# Patient Record
Sex: Male | Born: 1950 | Race: White | Hispanic: No | Marital: Married | Smoking: Former smoker
Health system: Southern US, Community
[De-identification: ages and names within clinical notes are randomized; demographics above are authoritative.]

## PROBLEM LIST (undated history)

## (undated) DIAGNOSIS — J449 Chronic obstructive pulmonary disease, unspecified: Secondary | ICD-10-CM

## (undated) DIAGNOSIS — E119 Type 2 diabetes mellitus without complications: Secondary | ICD-10-CM

## (undated) DIAGNOSIS — J45909 Unspecified asthma, uncomplicated: Secondary | ICD-10-CM

## (undated) DIAGNOSIS — H409 Unspecified glaucoma: Secondary | ICD-10-CM

## (undated) DIAGNOSIS — Z9889 Other specified postprocedural states: Secondary | ICD-10-CM

## (undated) DIAGNOSIS — C801 Malignant (primary) neoplasm, unspecified: Secondary | ICD-10-CM

## (undated) DIAGNOSIS — I10 Essential (primary) hypertension: Secondary | ICD-10-CM

## (undated) DIAGNOSIS — M1990 Unspecified osteoarthritis, unspecified site: Secondary | ICD-10-CM

## (undated) DIAGNOSIS — E785 Hyperlipidemia, unspecified: Secondary | ICD-10-CM

## (undated) DIAGNOSIS — G4730 Sleep apnea, unspecified: Secondary | ICD-10-CM

## (undated) DIAGNOSIS — K219 Gastro-esophageal reflux disease without esophagitis: Secondary | ICD-10-CM

## (undated) DIAGNOSIS — R112 Nausea with vomiting, unspecified: Secondary | ICD-10-CM

## (undated) HISTORY — DX: Unspecified osteoarthritis, unspecified site: M19.90

## (undated) HISTORY — DX: Hyperlipidemia, unspecified: E78.5

## (undated) HISTORY — PX: NECK SURGERY: SHX720

## (undated) HISTORY — DX: Unspecified glaucoma: H40.9

## (undated) HISTORY — PX: BACK SURGERY: SHX140

## (undated) HISTORY — PX: DUPUYTREN CONTRACTURE RELEASE: SHX1478

## (undated) HISTORY — DX: Gastro-esophageal reflux disease without esophagitis: K21.9

## (undated) HISTORY — DX: Unspecified asthma, uncomplicated: J45.909

## (undated) HISTORY — DX: Sleep apnea, unspecified: G47.30

---
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
## 1996-08-24 HISTORY — PX: KNEE ARTHROSCOPY: SUR90

## 2005-04-30 ENCOUNTER — Ambulatory Visit: Payer: Self-pay | Admitting: Gastroenterology

## 2005-05-08 ENCOUNTER — Ambulatory Visit: Payer: Self-pay | Admitting: Gastroenterology

## 2005-06-18 ENCOUNTER — Ambulatory Visit: Payer: Self-pay | Admitting: Gastroenterology

## 2006-04-19 ENCOUNTER — Other Ambulatory Visit: Payer: Self-pay

## 2006-04-21 ENCOUNTER — Ambulatory Visit: Payer: Self-pay | Admitting: Podiatry

## 2006-08-24 HISTORY — PX: EXCISION MORTON'S NEUROMA: SHX5013

## 2006-12-23 ENCOUNTER — Encounter: Payer: Self-pay | Admitting: Neurosurgery

## 2008-04-19 ENCOUNTER — Ambulatory Visit: Payer: Self-pay | Admitting: Family Medicine

## 2009-01-31 ENCOUNTER — Ambulatory Visit: Payer: Self-pay | Admitting: Family Medicine

## 2009-02-19 ENCOUNTER — Ambulatory Visit: Payer: Self-pay | Admitting: Nurse Practitioner

## 2009-04-04 ENCOUNTER — Ambulatory Visit: Payer: Self-pay | Admitting: Family Medicine

## 2010-10-26 ENCOUNTER — Ambulatory Visit: Payer: Self-pay | Admitting: Internal Medicine

## 2010-11-08 ENCOUNTER — Ambulatory Visit: Payer: Self-pay | Admitting: Internal Medicine

## 2010-11-15 ENCOUNTER — Ambulatory Visit: Payer: Self-pay | Admitting: Internal Medicine

## 2013-02-10 ENCOUNTER — Ambulatory Visit: Payer: Self-pay | Admitting: Unknown Physician Specialty

## 2015-08-20 ENCOUNTER — Encounter: Payer: Self-pay | Admitting: Respiratory Therapy

## 2015-08-20 ENCOUNTER — Encounter: Payer: BC Managed Care – PPO | Attending: Specialist | Admitting: Respiratory Therapy

## 2015-08-20 VITALS — Ht 73.0 in | Wt 264.0 lb

## 2015-08-20 DIAGNOSIS — M5030 Other cervical disc degeneration, unspecified cervical region: Secondary | ICD-10-CM | POA: Insufficient documentation

## 2015-08-20 DIAGNOSIS — E119 Type 2 diabetes mellitus without complications: Secondary | ICD-10-CM | POA: Insufficient documentation

## 2015-08-20 DIAGNOSIS — C439 Malignant melanoma of skin, unspecified: Secondary | ICD-10-CM | POA: Insufficient documentation

## 2015-08-20 DIAGNOSIS — G4733 Obstructive sleep apnea (adult) (pediatric): Secondary | ICD-10-CM

## 2015-08-20 DIAGNOSIS — I10 Essential (primary) hypertension: Secondary | ICD-10-CM | POA: Insufficient documentation

## 2015-08-20 DIAGNOSIS — J45909 Unspecified asthma, uncomplicated: Secondary | ICD-10-CM | POA: Insufficient documentation

## 2015-08-20 DIAGNOSIS — H409 Unspecified glaucoma: Secondary | ICD-10-CM | POA: Insufficient documentation

## 2015-08-20 DIAGNOSIS — E785 Hyperlipidemia, unspecified: Secondary | ICD-10-CM | POA: Insufficient documentation

## 2015-08-20 DIAGNOSIS — K219 Gastro-esophageal reflux disease without esophagitis: Secondary | ICD-10-CM | POA: Insufficient documentation

## 2015-08-20 DIAGNOSIS — J449 Chronic obstructive pulmonary disease, unspecified: Secondary | ICD-10-CM | POA: Diagnosis present

## 2015-08-20 DIAGNOSIS — K760 Fatty (change of) liver, not elsewhere classified: Secondary | ICD-10-CM | POA: Insufficient documentation

## 2015-08-20 DIAGNOSIS — K589 Irritable bowel syndrome without diarrhea: Secondary | ICD-10-CM | POA: Insufficient documentation

## 2015-08-20 DIAGNOSIS — J309 Allergic rhinitis, unspecified: Secondary | ICD-10-CM | POA: Insufficient documentation

## 2015-08-20 NOTE — Progress Notes (Signed)
Pulmonary Individual Treatment Plan  Patient Details  Name: Adrian Harris. MRN: [id number] Date of Birth: 10-06-1950 Referring Provider:  Erby Pian, MD  Initial Encounter Date: Date: 08/20/15  Visit Diagnosis: COPD, mild ([place])  Obstructive sleep apnea  Asthma, unspecified asthma severity, uncomplicated  Patient's Home Medications on Admission: No current outpatient prescriptions on file.  Past Medical History: No past medical history on file.  Tobacco Use: History  Smoking status  . Former Smoker -- 2.00 packs/day for 9 years  . Types: Cigarettes  Smokeless tobacco  . Former Systems developer  . Quit date: 11/11/1974    Labs: Recent Review Flowsheet Data    There is no flowsheet data to display.       ADL UCSD:     ADL UCSD      08/20/15 0830       ADL UCSD   ADL Phase Entry     SOB Score total 34     Rest 0     Walk 1     Stairs 2     Bath 0     Dress 0     Shop 2         Pulmonary Function Assessment:     Pulmonary Function Assessment - 08/20/15 0830    Pulmonary Function Tests   RV% 74 %   DLCO% 94 %   Initial Spirometry Results   FVC% 94 %   FEV1% 81 %   FEV1/FVC Ratio 68   Breath   Bilateral Breath Sounds Clear   Shortness of Breath Yes;Limiting activity      Exercise Target Goals: Date: 08/20/15  Exercise Program Goal: Individual exercise prescription set with THRR, safety & activity barriers. Participant demonstrates ability to understand and report RPE using BORG scale, to self-measure pulse accurately, and to acknowledge the importance of the exercise prescription.  Exercise Prescription Goal: Starting with aerobic activity 30 plus minutes a day, 3 days per week for initial exercise prescription. Provide home exercise prescription and guidelines that participant acknowledges understanding prior to discharge.  Activity Barriers & Risk Stratification:     Activity Barriers & Risk Stratification - 08/20/15 0830    Activity  Barriers & Risk Stratification   Activity Barriers Shortness of Breath;Arthritis;Deconditioning;Muscular Weakness   Risk Stratification Moderate      6 Minute Walk:     6 Minute Walk      08/20/15 1004       6 Minute Walk   Phase Initial     Distance 1700 feet     Walk Time 6 minutes     Resting HR 74 bpm     Resting BP 118/74 mmHg     Max Ex. HR 117 bpm     Max Ex. BP 144/76 mmHg     RPE 12     Perceived Dyspnea  3     Symptoms No        Initial Exercise Prescription:     Initial Exercise Prescription - 08/20/15 1000    Date of Initial Exercise Prescription   Date 08/20/15   Treadmill   MPH 2.5   Grade 0   Minutes 10   Recumbant Bike   Level 2   RPM 40   Watts 20   Minutes 10   NuStep   Level 2   Watts 50   Minutes 10   Arm Ergometer   Level 1   Watts 10   Minutes 10   Recumbant Elliptical  Level 2   RPM 40   Watts 20   Minutes 10   Elliptical   Level 1   Speed 3   Minutes 1   REL-XR   Level 3   Watts 50   Minutes 10   T5 Nustep   Level 1   Watts 20   Minutes 10   Biostep-RELP   Level 3   Watts 50   Minutes 10   Prescription Details   Duration Progress to 30 minutes of continuous aerobic without signs/symptoms of physical distress   Intensity   THRR REST +  30   Ratings of Perceived Exertion 11-15   Perceived Dyspnea 2-4   Progression Continue progressive overload as per policy without signs/symptoms or physical distress.   Resistance Training   Training Prescription Yes   Weight 2   Reps 10-15      Exercise Prescription Changes:   Discharge Exercise Prescription (Final Exercise Prescription Changes):    Nutrition:  Target Goals: Understanding of nutrition guidelines, daily intake of sodium 1500mg , cholesterol 200mg , calories 30% from fat and 7% or less from saturated fats, daily to have 5 or more servings of fruits and vegetables.  Biometrics:      Post Biometrics - 08/20/15 1006     Post  Biometrics   Height 6'  1" (1.854 m)   Weight 264 lb (119.75 kg)   Waist Circumference 46 inches   Hip Circumference 48 inches   Waist to Hip Ratio 0.96 %   BMI (Calculated) 34.9      Nutrition Therapy Plan and Nutrition Goals:     Nutrition Therapy & Goals - 08/20/15 0830    Nutrition Therapy   Diet Adrian Harris would like to loss weight, but he is not sure he wants to meet with the dietitian. He has cut down on his portion sizes and has recently lost 8lbs.      Nutrition Discharge: Rate Your Plate Scores:   Psychosocial: Target Goals: Acknowledge presence or absence of depression, maximize coping skills, provide positive support system. Participant is able to verbalize types and ability to use techniques and skills needed for reducing stress and depression.  Initial Review & Psychosocial Screening:     Initial Psych Review & Screening - 08/20/15 0830    Family Dynamics   Good Support System? Yes   Comments Adrian Harris has good support from his wife and children.   Barriers   Psychosocial barriers to participate in program There are no identifiable barriers or psychosocial needs.;The patient should benefit from training in stress management and relaxation.   Screening Interventions   Interventions Encouraged to exercise      Quality of Life Scores:     Quality of Life - 08/20/15 0830    Quality of Life Scores   Health/Function Pre 16.91 %   Socioeconomic Pre 28.17 %   Psych/Spiritual Pre 26.79 %   Family Pre 27.9 %   GLOBAL Pre 22.54 %      PHQ-9:     Recent Review Flowsheet Data    Depression screen [facility] 2/9 08/20/2015   Decreased Interest 0   Down, Depressed, Hopeless 0   PHQ - 2 Score 0      Psychosocial Evaluation and Intervention:   Psychosocial Re-Evaluation:  Education: Education Goals: Education classes will be provided on a weekly basis, covering required topics. Participant will state understanding/return demonstration of topics presented.  Learning  Barriers/Preferences:     Learning Barriers/Preferences - 08/20/15 0830  Learning Barriers/Preferences   Learning Barriers None   Learning Preferences Group Instruction;Individual Instruction;Pictoral;Skilled Demonstration;Verbal Instruction;Video;Written Material      Education Topics: Initial Evaluation Education: - Verbal, written and demonstration of respiratory meds, RPE/PD scales, oximetry and breathing techniques. Instruction on use of nebulizers and MDIs: cleaning and proper use, rinsing mouth with steroid doses and importance of monitoring MDI activations.          Pulmonary Rehab from 08/20/2015 in [place]   Date  08/20/15   Educator  LB   Instruction Review Code  2- meets goals/outcomes      General Nutrition Guidelines/Fats and Fiber: -Group instruction provided by verbal, written material, models and posters to present the general guidelines for heart healthy nutrition. Gives an explanation and review of dietary fats and fiber.   Controlling Sodium/Reading Food Labels: -Group verbal and written material supporting the discussion of sodium use in heart healthy nutrition. Review and explanation with models, verbal and written materials for utilization of the food label.   Exercise Physiology & Risk Factors: - Group verbal and written instruction with models to review the exercise physiology of the cardiovascular system and associated critical values. Details cardiovascular disease risk factors and the goals associated with each risk factor.   Aerobic Exercise & Resistance Training: - Gives group verbal and written discussion on the health impact of inactivity. On the components of aerobic and resistive training programs and the benefits of this training and how to safely progress through these programs.   Flexibility, Balance, General Exercise Guidelines: - Provides group verbal and written instruction on the benefits of  flexibility and balance training programs. Provides general exercise guidelines with specific guidelines to those with heart or lung disease. Demonstration and skill practice provided.   Stress Management: - Provides group verbal and written instruction about the health risks of elevated stress, cause of high stress, and healthy ways to reduce stress.   Depression: - Provides group verbal and written instruction on the correlation between heart/lung disease and depressed mood, treatment options, and the stigmas associated with seeking treatment.   Exercise & Equipment Safety: - Individual verbal instruction and demonstration of equipment use and safety with use of the equipment.   Infection Prevention: - Provides verbal and written material to individual with discussion of infection control including proper hand washing and proper equipment cleaning during exercise session.      Pulmonary Rehab from 08/20/2015 in [place]   Date  08/20/15   Educator  LB   Instruction Review Code  2- meets goals/outcomes      Falls Prevention: - Provides verbal and written material to individual with discussion of falls prevention and safety.      Pulmonary Rehab from 08/20/2015 in [place]   Date  08/20/15   Educator  LB   Instruction Review Code  2- meets goals/outcomes      Diabetes: - Individual verbal and written instruction to review signs/symptoms of diabetes, desired ranges of glucose level fasting, after meals and with exercise. Advice that pre and post exercise glucose checks will be done for 3 sessions at entry of program.   Chronic Lung Diseases: - Group verbal and written instruction to review new updates, new respiratory medications, new advancements in procedures and treatments. Provide informative websites and "800" numbers of self-education.   Lung Procedures: - Group verbal and written  instruction to describe testing methods done to diagnose lung disease.  Review the outcome of test results. Describe the treatment choices: Pulmonary Function Tests, ABGs and oximetry.   Energy Conservation: - Provide group verbal and written instruction for methods to conserve energy, plan and organize activities. Instruct on pacing techniques, use of adaptive equipment and posture/positioning to relieve shortness of breath.   Triggers: - Group verbal and written instruction to review types of environmental controls: home humidity, furnaces, filters, dust mite/pet prevention, HEPA vacuums. To discuss weather changes, air quality and the benefits of nasal washing.   Exacerbations: - Group verbal and written instruction to provide: warning signs, infection symptoms, calling MD promptly, preventive modes, and value of vaccinations. Review: effective airway clearance, coughing and/or vibration techniques. Create an Sports administrator.   Oxygen: - Individual and group verbal and written instruction on oxygen therapy. Includes supplement oxygen, available portable oxygen systems, continuous and intermittent flow rates, oxygen safety, concentrators, and Medicare reimbursement for oxygen.   Respiratory Medications: - Group verbal and written instruction to review medications for lung disease. Drug class, frequency, complications, importance of spacers, rinsing mouth after steroid MDI's, and proper cleaning methods for nebulizers.      Pulmonary Rehab from 08/20/2015 in [place]   Date  08/20/15   Educator  LB   Instruction Review Code  2- meets goals/outcomes      AED/CPR: - Group verbal and written instruction with the use of models to demonstrate the basic use of the AED with the basic ABC's of resuscitation.   Breathing Retraining: - Provides individuals verbal and written instruction on purpose, frequency, and proper technique of diaphragmatic breathing  and pursed-lipped breathing. Applies individual practice skills.      Pulmonary Rehab from 08/20/2015 in [place]   Date  08/20/15   Educator  LB   Instruction Review Code  2- meets goals/outcomes      Anatomy and Physiology of the Lungs: - Group verbal and written instruction with the use of models to provide basic lung anatomy and physiology related to function, structure and complications of lung disease.   Heart Failure: - Group verbal and written instruction on the basics of heart failure: signs/symptoms, treatments, explanation of ejection fraction, enlarged heart and cardiomyopathy.   Sleep Apnea: - Individual verbal and written instruction to review Obstructive Sleep Apnea. Review of risk factors, methods for diagnosing and types of masks and machines for OSA.   Anxiety: - Provides group, verbal and written instruction on the correlation between heart/lung disease and anxiety, treatment options, and management of anxiety.   Relaxation: - Provides group, verbal and written instruction about the benefits of relaxation for patients with heart/lung disease. Also provides patients with examples of relaxation techniques.   Knowledge Questionnaire Score:     Knowledge Questionnaire Score - 08/20/15 0830    Knowledge Questionnaire Score   Pre Score -3      Personal Goals and Risk Factors at Admission:     Personal Goals and Risk Factors at Admission - 08/20/15 0830    Personal Goals and Risk Factors on Admission    Weight Management Yes   Intervention Learn and follow the exercise and diet guidelines while in the program. Utilize the nutrition and education classes to help gain knowledge of the diet and exercise expectations in the program  Adrian Harris would like to loss weight, but he is not sure he wants to meet with the dietitian. He has cut down on his portion sizes and has recently lost  8lbs.   Admit Weight 264 lb (119.75 kg)    Goal Weight 200 lb (90.719 kg)   Increase Aerobic Exercise and Physical Activity Yes   Intervention While in program, learn and follow the exercise prescription taught. Start at a low level workload and increase workload after able to maintain previous level for 30 minutes. Increase time before increasing intensity.  Adrian Harris would like to increase his exercise capacity. He has a home treadmill, stationary bike, and weights.   Understand more about Heart/Pulmonary Disease. Yes   Intervention While in program utilize professionals for any questions, and attend the education sessions. Great websites to use are www.americanheart.org or www.lung.org for reliable information.  Adrian Harris has Mild COPD, Asthma, and OSA and is very interested in learning new knowledge of these diseases and how to manage them.   Improve shortness of breath with ADL's Yes   Intervention While in program, learn and follow the exercise prescription taught. Start at a low level workload and increase workload ad advised by the exercise physiologist. Increase time before increasing intensity.  Adrian Harris goes at thing "all out" and does experience shortness of breath. He will benefit from supervised exercise, pacing , and PLB.   Develop more efficient breathing techniques such as purse lipped breathing and diaphragmatic breathing; and practicing self-pacing with activity Yes   Intervention While in program, learn and utilize the specific breathing techniques taught to you. Continue to practice and use the techniques as needed.  Demonstrated good technique and understanding of PLBing   Increase knowledge of respiratory medications and ability to use respiratory devices properly.  Yes   Intervention While in program, learn to administer MDI, nebulizer, and spacer properly.;Learn to take respiratory medicine as ordered.;While in program, learn to Clean MDI, nebulizers, and spacers properly.  Adrian Harris takes Spiriva, Symbicort, and  Proventil MDI. Educated on these inhalers and gave Adrian Harris a spacer. Adrian Harris is also on CPAP, full mask, provided by the [place].   Diabetes Yes   Goal Blood glucose control identified by blood glucose values, HgbA1C. Participant verbalizes understanding of the signs/symptoms of hyper/hypo glycemia, proper foot care and importance of medication and nutrition plan for blood glucose control.   Intervention Provide nutrition & aerobic exercise along with prescribed medications to achieve blood glucose in normal ranges: Fasting 65-99 mg/dL   Hypertension Yes   Goal Participant will see blood pressure controlled within the values of 140/11mm/Hg or within value directed by their physician.   Intervention Provide nutrition & aerobic exercise along with prescribed medications to achieve BP 140/90 or less.      Personal Goals and Risk Factors Review:    Personal Goals Discharge (Final Personal Goals and Risk Factors Review):    ITP Comments:   Comments:

## 2015-08-20 NOTE — Patient Instructions (Signed)
Patient Instructions  Patient Details  Name: Avyn Hennesy. MRN: [id number] Date of Birth: July 30, 1951 Referring Provider:  Erby Pian, MD  Below are the personal goals you chose as well as exercise and nutrition goals. Our goal is to help you keep on track towards obtaining and maintaining your goals. We will be discussing your progress on these goals with you throughout the program.  Initial Exercise Prescription:     Initial Exercise Prescription - 08/20/15 1000    Date of Initial Exercise Prescription   Date 08/20/15   Treadmill   MPH 2.5   Grade 0   Minutes 10   Recumbant Bike   Level 2   RPM 40   Watts 20   Minutes 10   NuStep   Level 2   Watts 50   Minutes 10   Arm Ergometer   Level 1   Watts 10   Minutes 10   Recumbant Elliptical   Level 2   RPM 40   Watts 20   Minutes 10   Elliptical   Level 1   Speed 3   Minutes 1   REL-XR   Level 3   Watts 50   Minutes 10   T5 Nustep   Level 1   Watts 20   Minutes 10   Biostep-RELP   Level 3   Watts 50   Minutes 10   Prescription Details   Duration Progress to 30 minutes of continuous aerobic without signs/symptoms of physical distress   Intensity   THRR REST +  30   Ratings of Perceived Exertion 11-15   Perceived Dyspnea 2-4   Progression Continue progressive overload as per policy without signs/symptoms or physical distress.   Resistance Training   Training Prescription Yes   Weight 2   Reps 10-15      Exercise Goals: Frequency: Be able to perform aerobic exercise three times per week working toward 3-5 days per week.  Intensity: Work with a perceived exertion of 11 (fairly light) - 15 (hard) as tolerated. Follow your new exercise prescription and watch for changes in prescription as you progress with the program. Changes will be reviewed with you when they are made.  Duration: You should be able to do 30 minutes of continuous aerobic exercise in addition to a 5 minute warm-up and a 5  minute cool-down routine.  Nutrition Goals: Your personal nutrition goals will be established when you do your nutrition analysis with the dietician.  The following are nutrition guidelines to follow: Cholesterol < 200mg /day Sodium < 1500mg /day Fiber: Men over 50 yrs - 30 grams per day  Personal Goals:     Personal Goals and Risk Factors at Admission - 08/20/15 0830    Personal Goals and Risk Factors on Admission    Weight Management Yes   Intervention Learn and follow the exercise and diet guidelines while in the program. Utilize the nutrition and education classes to help gain knowledge of the diet and exercise expectations in the program  Mr Fawver would like to loss weight, but he is not sure he wants to meet with the dietitian. He has cut down on his portion sizes and has recently lost 8lbs.   Admit Weight 264 lb (119.75 kg)   Goal Weight 200 lb (90.719 kg)   Increase Aerobic Exercise and Physical Activity Yes   Intervention While in program, learn and follow the exercise prescription taught. Start at a low level workload and increase workload after able  to maintain previous level for 30 minutes. Increase time before increasing intensity.  Mr Sollenberger would like to increase his exercise capacity. He has a home treadmill, stationary bike, and weights.   Understand more about Heart/Pulmonary Disease. Yes   Intervention While in program utilize professionals for any questions, and attend the education sessions. Great websites to use are www.americanheart.org or www.lung.org for reliable information.  Ms Colvin has Mild COPD, Asthma, and OSA and is very interested in learning new knowledge of these diseases and how to manage them.   Improve shortness of breath with ADL's Yes   Intervention While in program, learn and follow the exercise prescription taught. Start at a low level workload and increase workload ad advised by the exercise physiologist. Increase time before increasing intensity.   Mr Morera goes at thing "all out" and does experience shortness of breath. He will benefit from supervised exercise, pacing , and PLB.   Develop more efficient breathing techniques such as purse lipped breathing and diaphragmatic breathing; and practicing self-pacing with activity Yes   Intervention While in program, learn and utilize the specific breathing techniques taught to you. Continue to practice and use the techniques as needed.  Demonstrated good technique and understanding of PLBing   Increase knowledge of respiratory medications and ability to use respiratory devices properly.  Yes   Intervention While in program, learn to administer MDI, nebulizer, and spacer properly.;Learn to take respiratory medicine as ordered.;While in program, learn to Clean MDI, nebulizers, and spacers properly.  Mr Saric takes Spiriva, Symbicort, and Proventil MDI. Educated on these inhalers and gave Mr Dollard a spacer. Mr Ehle is also on CPAP, full mask, provided by the [place].   Diabetes Yes   Goal Blood glucose control identified by blood glucose values, HgbA1C. Participant verbalizes understanding of the signs/symptoms of hyper/hypo glycemia, proper foot care and importance of medication and nutrition plan for blood glucose control.   Intervention Provide nutrition & aerobic exercise along with prescribed medications to achieve blood glucose in normal ranges: Fasting 65-99 mg/dL   Hypertension Yes   Goal Participant will see blood pressure controlled within the values of 140/69mm/Hg or within value directed by their physician.   Intervention Provide nutrition & aerobic exercise along with prescribed medications to achieve BP 140/90 or less.      Tobacco Use Initial Evaluation: History  Smoking status  . Former Smoker -- 2.00 packs/day for 9 years  . Types: Cigarettes  Smokeless tobacco  . Former Systems developer  . Quit date: 11/11/1974    Copy of goals given to participant.

## 2015-08-21 NOTE — Progress Notes (Signed)
Pulmonary Individual Treatment Plan  Patient Details  Name: Adrian Harris. MRN: [id number] Date of Birth: 24-Sep-1950 Referring Provider:  Erby Pian, MD  Initial Encounter Date: Date: 08/20/15  Visit Diagnosis: COPD, mild ([place]) - Plan: PULMONARY REHAB 30 DAY REVIEW  Obstructive sleep apnea - Plan: PULMONARY REHAB 30 DAY REVIEW  Asthma, unspecified asthma severity, uncomplicated - Plan: PULMONARY REHAB 30 DAY REVIEW  Patient's Home Medications on Admission:  Current outpatient prescriptions:    albuterol (PROAIR HFA) 108 (90 BASE) MCG/ACT inhaler, Inhale into the lungs., Disp: , Rfl:    glucose blood (ACCU-CHEK COMFORT CURVE) test strip, , Disp: , Rfl:    lisinopril (PRINIVIL,ZESTRIL) 20 MG tablet, Take by mouth., Disp: , Rfl:    metFORMIN (GLUCOPHAGE-XR) 500 MG 24 hr tablet, Take by mouth., Disp: , Rfl:    mometasone (NASONEX) 50 MCG/ACT nasal spray, , Disp: , Rfl:    budesonide-formoterol (SYMBICORT) 160-4.5 MCG/ACT inhaler, Inhale into the lungs., Disp: , Rfl:    Cholecalciferol (VITAMIN D-1000 MAX ST) 1000 UNITS tablet, Take by mouth., Disp: , Rfl:    tiotropium (SPIRIVA) 18 MCG inhalation capsule, Place into inhaler and inhale., Disp: , Rfl:    vitamin B-12 (CYANOCOBALAMIN) 1000 MCG tablet, Take by mouth., Disp: , Rfl:   Past Medical History: No past medical history on file.  Tobacco Use: History  Smoking status   Former Smoker -- 2.00 packs/day for 9 years   Types: Cigarettes  Smokeless tobacco   Former Systems developer   Quit date: 11/11/1974    Labs: Recent Review Flowsheet Data    There is no flowsheet data to display.       ADL UCSD:     ADL UCSD      08/20/15 0830       ADL UCSD   ADL Phase Entry     SOB Score total 34     Rest 0     Walk 1     Stairs 2     Bath 0     Dress 0     Shop 2         Pulmonary Function Assessment:     Pulmonary Function Assessment - 08/20/15 0830    Pulmonary Function Tests   RV% 74 %   DLCO% 94  %   Initial Spirometry Results   FVC% 94 %   FEV1% 81 %   FEV1/FVC Ratio 68   Breath   Bilateral Breath Sounds Clear   Shortness of Breath Yes;Limiting activity      Exercise Target Goals: Date: 08/20/15  Exercise Program Goal: Individual exercise prescription set with THRR, safety & activity barriers. Participant demonstrates ability to understand and report RPE using BORG scale, to self-measure pulse accurately, and to acknowledge the importance of the exercise prescription.  Exercise Prescription Goal: Starting with aerobic activity 30 plus minutes a day, 3 days per week for initial exercise prescription. Provide home exercise prescription and guidelines that participant acknowledges understanding prior to discharge.  Activity Barriers & Risk Stratification:     Activity Barriers & Risk Stratification - 08/20/15 0830    Activity Barriers & Risk Stratification   Activity Barriers Shortness of Breath;Arthritis;Deconditioning;Muscular Weakness   Risk Stratification Moderate      6 Minute Walk:     6 Minute Walk      08/20/15 1004       6 Minute Walk   Phase Initial     Distance 1700 feet     Walk  Time 6 minutes     Resting HR 74 bpm     Resting BP 118/74 mmHg     Max Ex. HR 117 bpm     Max Ex. BP 144/76 mmHg     RPE 12     Perceived Dyspnea  3     Symptoms No        Initial Exercise Prescription:     Initial Exercise Prescription - 08/20/15 1000    Date of Initial Exercise Prescription   Date 08/20/15   Treadmill   MPH 2.5   Grade 0   Minutes 10   Recumbant Bike   Level 2   RPM 40   Watts 20   Minutes 10   NuStep   Level 2   Watts 50   Minutes 10   Arm Ergometer   Level 1   Watts 10   Minutes 10   Recumbant Elliptical   Level 2   RPM 40   Watts 20   Minutes 10   Elliptical   Level 1   Speed 3   Minutes 1   REL-XR   Level 3   Watts 50   Minutes 10   T5 Nustep   Level 1   Watts 20   Minutes 10   Biostep-RELP   Level 3   Watts  50   Minutes 10   Prescription Details   Duration Progress to 30 minutes of continuous aerobic without signs/symptoms of physical distress   Intensity   THRR REST +  30   Ratings of Perceived Exertion 11-15   Perceived Dyspnea 2-4   Progression Continue progressive overload as per policy without signs/symptoms or physical distress.   Resistance Training   Training Prescription Yes   Weight 2   Reps 10-15      Exercise Prescription Changes:   Discharge Exercise Prescription (Final Exercise Prescription Changes):    Nutrition:  Target Goals: Understanding of nutrition guidelines, daily intake of sodium 1500mg , cholesterol 200mg , calories 30% from fat and 7% or less from saturated fats, daily to have 5 or more servings of fruits and vegetables.  Biometrics:      Post Biometrics - 08/20/15 1006     Post  Biometrics   Height 6\' 1"  (1.854 m)   Weight 264 lb (119.75 kg)   Waist Circumference 46 inches   Hip Circumference 48 inches   Waist to Hip Ratio 0.96 %   BMI (Calculated) 34.9      Nutrition Therapy Plan and Nutrition Goals:     Nutrition Therapy & Goals - 08/20/15 0830    Nutrition Therapy   Diet Mr Tlatelpa would like to loss weight, but he is not sure he wants to meet with the dietitian. He has cut down on his portion sizes and has recently lost 8lbs.      Nutrition Discharge: Rate Your Plate Scores:   Psychosocial: Target Goals: Acknowledge presence or absence of depression, maximize coping skills, provide positive support system. Participant is able to verbalize types and ability to use techniques and skills needed for reducing stress and depression.  Initial Review & Psychosocial Screening:     Initial Psych Review & Screening - 08/20/15 0830    Family Dynamics   Good Support System? Yes   Comments Mr Vieyra has good support from his wife and children.   Barriers   Psychosocial barriers to participate in program There are no identifiable barriers or  psychosocial needs.;The patient should benefit from  training in stress management and relaxation.   Screening Interventions   Interventions Encouraged to exercise      Quality of Life Scores:     Quality of Life - 08/20/15 0830    Quality of Life Scores   Health/Function Pre 16.91 %   Socioeconomic Pre 28.17 %   Psych/Spiritual Pre 26.79 %   Family Pre 27.9 %   GLOBAL Pre 22.54 %      PHQ-9:     Recent Review Flowsheet Data    Depression screen [facility] 2/9 08/20/2015   Decreased Interest 0   Down, Depressed, Hopeless 0   PHQ - 2 Score 0      Psychosocial Evaluation and Intervention:   Psychosocial Re-Evaluation:  Education: Education Goals: Education classes will be provided on a weekly basis, covering required topics. Participant will state understanding/return demonstration of topics presented.  Learning Barriers/Preferences:     Learning Barriers/Preferences - 08/20/15 0830    Learning Barriers/Preferences   Learning Barriers None   Learning Preferences Group Instruction;Individual Instruction;Pictoral;Skilled Demonstration;Verbal Instruction;Video;Written Material      Education Topics: Initial Evaluation Education: - Verbal, written and demonstration of respiratory meds, RPE/PD scales, oximetry and breathing techniques. Instruction on use of nebulizers and MDIs: cleaning and proper use, rinsing mouth with steroid doses and importance of monitoring MDI activations.          Pulmonary Rehab from 08/20/2015 in [place]   Date  08/20/15   Educator  LB   Instruction Review Code  2- meets goals/outcomes      General Nutrition Guidelines/Fats and Fiber: -Group instruction provided by verbal, written material, models and posters to present the general guidelines for heart healthy nutrition. Gives an explanation and review of dietary fats and fiber.   Controlling Sodium/Reading Food Labels: -Group verbal and written  material supporting the discussion of sodium use in heart healthy nutrition. Review and explanation with models, verbal and written materials for utilization of the food label.   Exercise Physiology & Risk Factors: - Group verbal and written instruction with models to review the exercise physiology of the cardiovascular system and associated critical values. Details cardiovascular disease risk factors and the goals associated with each risk factor.   Aerobic Exercise & Resistance Training: - Gives group verbal and written discussion on the health impact of inactivity. On the components of aerobic and resistive training programs and the benefits of this training and how to safely progress through these programs.   Flexibility, Balance, General Exercise Guidelines: - Provides group verbal and written instruction on the benefits of flexibility and balance training programs. Provides general exercise guidelines with specific guidelines to those with heart or lung disease. Demonstration and skill practice provided.   Stress Management: - Provides group verbal and written instruction about the health risks of elevated stress, cause of high stress, and healthy ways to reduce stress.   Depression: - Provides group verbal and written instruction on the correlation between heart/lung disease and depressed mood, treatment options, and the stigmas associated with seeking treatment.   Exercise & Equipment Safety: - Individual verbal instruction and demonstration of equipment use and safety with use of the equipment.   Infection Prevention: - Provides verbal and written material to individual with discussion of infection control including proper hand washing and proper equipment cleaning during exercise session.      Pulmonary Rehab from 08/20/2015 in [place]   Date  08/20/15   Educator  LB  Instruction Review Code  2- meets goals/outcomes      Falls  Prevention: - Provides verbal and written material to individual with discussion of falls prevention and safety.      Pulmonary Rehab from 08/20/2015 in [place]   Date  08/20/15   Educator  LB   Instruction Review Code  2- meets goals/outcomes      Diabetes: - Individual verbal and written instruction to review signs/symptoms of diabetes, desired ranges of glucose level fasting, after meals and with exercise. Advice that pre and post exercise glucose checks will be done for 3 sessions at entry of program.   Chronic Lung Diseases: - Group verbal and written instruction to review new updates, new respiratory medications, new advancements in procedures and treatments. Provide informative websites and "800" numbers of self-education.   Lung Procedures: - Group verbal and written instruction to describe testing methods done to diagnose lung disease. Review the outcome of test results. Describe the treatment choices: Pulmonary Function Tests, ABGs and oximetry.   Energy Conservation: - Provide group verbal and written instruction for methods to conserve energy, plan and organize activities. Instruct on pacing techniques, use of adaptive equipment and posture/positioning to relieve shortness of breath.   Triggers: - Group verbal and written instruction to review types of environmental controls: home humidity, furnaces, filters, dust mite/pet prevention, HEPA vacuums. To discuss weather changes, air quality and the benefits of nasal washing.   Exacerbations: - Group verbal and written instruction to provide: warning signs, infection symptoms, calling MD promptly, preventive modes, and value of vaccinations. Review: effective airway clearance, coughing and/or vibration techniques. Create an Sports administrator.   Oxygen: - Individual and group verbal and written instruction on oxygen therapy. Includes supplement oxygen, available portable oxygen systems,  continuous and intermittent flow rates, oxygen safety, concentrators, and Medicare reimbursement for oxygen.   Respiratory Medications: - Group verbal and written instruction to review medications for lung disease. Drug class, frequency, complications, importance of spacers, rinsing mouth after steroid MDI's, and proper cleaning methods for nebulizers.      Pulmonary Rehab from 08/20/2015 in [place]   Date  08/20/15   Educator  LB   Instruction Review Code  2- meets goals/outcomes      AED/CPR: - Group verbal and written instruction with the use of models to demonstrate the basic use of the AED with the basic ABC's of resuscitation.   Breathing Retraining: - Provides individuals verbal and written instruction on purpose, frequency, and proper technique of diaphragmatic breathing and pursed-lipped breathing. Applies individual practice skills.      Pulmonary Rehab from 08/20/2015 in [place]   Date  08/20/15   Educator  LB   Instruction Review Code  2- meets goals/outcomes      Anatomy and Physiology of the Lungs: - Group verbal and written instruction with the use of models to provide basic lung anatomy and physiology related to function, structure and complications of lung disease.   Heart Failure: - Group verbal and written instruction on the basics of heart failure: signs/symptoms, treatments, explanation of ejection fraction, enlarged heart and cardiomyopathy.   Sleep Apnea: - Individual verbal and written instruction to review Obstructive Sleep Apnea. Review of risk factors, methods for diagnosing and types of masks and machines for OSA.   Anxiety: - Provides group, verbal and written instruction on the correlation between heart/lung disease and anxiety, treatment options, and management of anxiety.  Relaxation: - Provides group, verbal and written instruction about the benefits of  relaxation for patients with heart/lung disease. Also provides patients with examples of relaxation techniques.   Knowledge Questionnaire Score:     Knowledge Questionnaire Score - 08/20/15 0830    Knowledge Questionnaire Score   Pre Score -3      Personal Goals and Risk Factors at Admission:     Personal Goals and Risk Factors at Admission - 08/20/15 0830    Personal Goals and Risk Factors on Admission    Weight Management Yes   Intervention Learn and follow the exercise and diet guidelines while in the program. Utilize the nutrition and education classes to help gain knowledge of the diet and exercise expectations in the program  Mr Pulliam would like to loss weight, but he is not sure he wants to meet with the dietitian. He has cut down on his portion sizes and has recently lost 8lbs.   Admit Weight 264 lb (119.75 kg)   Goal Weight 200 lb (90.719 kg)   Increase Aerobic Exercise and Physical Activity Yes   Intervention While in program, learn and follow the exercise prescription taught. Start at a low level workload and increase workload after able to maintain previous level for 30 minutes. Increase time before increasing intensity.  Mr Worth would like to increase his exercise capacity. He has a home treadmill, stationary bike, and weights.   Understand more about Heart/Pulmonary Disease. Yes   Intervention While in program utilize professionals for any questions, and attend the education sessions. Great websites to use are www.americanheart.org or www.lung.org for reliable information.  Ms Poellnitz has Mild COPD, Asthma, and OSA and is very interested in learning new knowledge of these diseases and how to manage them.   Improve shortness of breath with ADL's Yes   Intervention While in program, learn and follow the exercise prescription taught. Start at a low level workload and increase workload ad advised by the exercise physiologist. Increase time before increasing intensity.  Mr  Rotert goes at thing "all out" and does experience shortness of breath. He will benefit from supervised exercise, pacing , and PLB.   Develop more efficient breathing techniques such as purse lipped breathing and diaphragmatic breathing; and practicing self-pacing with activity Yes   Intervention While in program, learn and utilize the specific breathing techniques taught to you. Continue to practice and use the techniques as needed.  Demonstrated good technique and understanding of PLBing   Increase knowledge of respiratory medications and ability to use respiratory devices properly.  Yes   Intervention While in program, learn to administer MDI, nebulizer, and spacer properly.;Learn to take respiratory medicine as ordered.;While in program, learn to Clean MDI, nebulizers, and spacers properly.  Mr Chhim takes Spiriva, Symbicort, and Proventil MDI. Educated on these inhalers and gave Mr Cogan a spacer. Mr Watling is also on CPAP, full mask, provided by the [place].   Diabetes Yes   Goal Blood glucose control identified by blood glucose values, HgbA1C. Participant verbalizes understanding of the signs/symptoms of hyper/hypo glycemia, proper foot care and importance of medication and nutrition plan for blood glucose control.   Intervention Provide nutrition & aerobic exercise along with prescribed medications to achieve blood glucose in normal ranges: Fasting 65-99 mg/dL   Hypertension Yes   Goal Participant will see blood pressure controlled within the values of 140/95mm/Hg or within value directed by their physician.   Intervention Provide nutrition & aerobic exercise along with prescribed medications to achieve  BP 140/90 or less.      Personal Goals and Risk Factors Review:    Personal Goals Discharge (Final Personal Goals and Risk Factors Review):    ITP Comments:   Comments: Mr Odom has a co-pay through [facility] of $40.00 with each visit, so he plans to go through the [place] for [place]. His  start date for exercise depends on their authorization date.

## 2015-09-09 ENCOUNTER — Encounter: Payer: BC Managed Care – PPO | Attending: Specialist | Admitting: *Deleted

## 2015-09-09 DIAGNOSIS — J449 Chronic obstructive pulmonary disease, unspecified: Secondary | ICD-10-CM | POA: Insufficient documentation

## 2015-09-09 DIAGNOSIS — J4520 Mild intermittent asthma, uncomplicated: Secondary | ICD-10-CM

## 2015-09-09 DIAGNOSIS — G4730 Sleep apnea, unspecified: Secondary | ICD-10-CM

## 2015-09-09 LAB — GLUCOSE, CAPILLARY: GLUCOSE-CAPILLARY: 121 mg/dL — AB (ref 65–99)

## 2015-09-09 NOTE — Progress Notes (Signed)
Daily Session Note  Patient Details  Name: Adrian Harris. MRN: [id number] Date of Birth: [birth year] Referring Provider:  Erby Pian, MD  Encounter Date: 09/09/2015  Check In:     Session Check In - 09/09/15 1107    Check-In   Staff Present Lestine Box, BS, ACSM EP-C, Exercise Physiologist;Kenosha Doster Amedeo Plenty, BS, ACSM CEP, Exercise Physiologist;Laureen Owens Shark, BS, RRT, Respiratory Therapist   ER physicians immediately available to respond to emergencies [facility] immediately available ER MD   Physician(s) Dr. Clearnce Hasten and Marcelene Butte   Medication changes reported     No   Fall or balance concerns reported    No   Warm-up and Cool-down Performed on first and last piece of equipment   VAD Patient? No   Pain Assessment   Currently in Pain? Yes   Pain Location Back   Pain Type Chronic pain   Multiple Pain Sites Yes   2nd Pain Site   Pain Type Chronic pain   Pain Location Hand   Pain Orientation Right;Left         Goals Met:  Proper associated with RPD/PD & O2 Sat Independence with exercise equipment Exercise tolerated well Strength training completed today  Goals Unmet:  Not Applicable  Goals Comments: Patient completed exercise prescription and all exercise goals during rehab session. The exercise was tolerated well and the patient is progressing in the program.     Dr. Emily Filbert is Medical Director for [place] and [facility].

## 2015-09-10 ENCOUNTER — Telehealth: Payer: Self-pay

## 2015-09-10 NOTE — Telephone Encounter (Signed)
Informed the pt. Staff has spoken to the [place], they do not have an authorization for him currently, but according to the pt. The doctor there acknowledged the need for one and was going to take care of it.  Urged the pt. To contact us if they hear anything further, or if they have not heard from [place] in a week or so.

## 2015-09-16 ENCOUNTER — Encounter: Payer: BC Managed Care – PPO | Admitting: Respiratory Therapy

## 2015-09-16 NOTE — Progress Notes (Signed)
Pulmonary Individual Treatment Plan  Patient Details  Name: Adrian Harris. MRN: [id number] Date of Birth: 1950/09/09 Referring Provider:  Erby Pian, MD  Initial Encounter Date: 08/20/2015  Visit Diagnosis: COPD, mild ([place])  Asthma, mild intermittent, uncomplicated  Sleep apnea  Patient's Home Medications on Admission:  Current outpatient prescriptions:    albuterol (PROAIR HFA) 108 (90 BASE) MCG/ACT inhaler, Inhale into the lungs., Disp: , Rfl:    budesonide-formoterol (SYMBICORT) 160-4.5 MCG/ACT inhaler, Inhale into the lungs., Disp: , Rfl:    Cholecalciferol (VITAMIN D-1000 MAX ST) 1000 UNITS tablet, Take by mouth., Disp: , Rfl:    glucose blood (ACCU-CHEK COMFORT CURVE) test strip, , Disp: , Rfl:    lisinopril (PRINIVIL,ZESTRIL) 20 MG tablet, Take by mouth., Disp: , Rfl:    metFORMIN (GLUCOPHAGE-XR) 500 MG 24 hr tablet, Take by mouth., Disp: , Rfl:    mometasone (NASONEX) 50 MCG/ACT nasal spray, , Disp: , Rfl:    tiotropium (SPIRIVA) 18 MCG inhalation capsule, Place into inhaler and inhale., Disp: , Rfl:    vitamin B-12 (CYANOCOBALAMIN) 1000 MCG tablet, Take by mouth., Disp: , Rfl:   Past Medical History: No past medical history on file.  Tobacco Use: History  Smoking status   Former Smoker -- 2.00 packs/day for 9 years   Types: Cigarettes  Smokeless tobacco   Former Systems developer   Quit date: 11/11/1974    Labs: Recent Review Flowsheet Data    There is no flowsheet data to display.       ADL UCSD:     ADL UCSD      08/20/15 0830       ADL UCSD   ADL Phase Entry     SOB Score total 34     Rest 0     Walk 1     Stairs 2     Bath 0     Dress 0     Shop 2         Pulmonary Function Assessment:     Pulmonary Function Assessment - 08/20/15 0830    Pulmonary Function Tests   RV% 74 %   DLCO% 94 %   Initial Spirometry Results   FVC% 94 %   FEV1% 81 %   FEV1/FVC Ratio 68   Breath   Bilateral Breath Sounds Clear   Shortness of  Breath Yes;Limiting activity      Exercise Target Goals:    Exercise Program Goal: Individual exercise prescription set with THRR, safety & activity barriers. Participant demonstrates ability to understand and report RPE using BORG scale, to self-measure pulse accurately, and to acknowledge the importance of the exercise prescription.  Exercise Prescription Goal: Starting with aerobic activity 30 plus minutes a day, 3 days per week for initial exercise prescription. Provide home exercise prescription and guidelines that participant acknowledges understanding prior to discharge.  Activity Barriers & Risk Stratification:     Activity Barriers & Risk Stratification - 08/20/15 0830    Activity Barriers & Risk Stratification   Activity Barriers Shortness of Breath;Arthritis;Deconditioning;Muscular Weakness   Risk Stratification Moderate      6 Minute Walk:     6 Minute Walk      08/20/15 1004       6 Minute Walk   Phase Initial     Distance 1700 feet     Walk Time 6 minutes     Resting HR 74 bpm     Resting BP 118/74 mmHg     Max  Ex. HR 117 bpm     Max Ex. BP 144/76 mmHg     RPE 12     Perceived Dyspnea  3     Symptoms No        Initial Exercise Prescription:     Initial Exercise Prescription - 08/20/15 1000    Date of Initial Exercise Prescription   Date 08/20/15   Treadmill   MPH 2.5   Grade 0   Minutes 10   Recumbant Bike   Level 2   RPM 40   Watts 20   Minutes 10   NuStep   Level 2   Watts 50   Minutes 10   Arm Ergometer   Level 1   Watts 10   Minutes 10   Recumbant Elliptical   Level 2   RPM 40   Watts 20   Minutes 10   Elliptical   Level 1   Speed 3   Minutes 1   REL-XR   Level 3   Watts 50   Minutes 10   T5 Nustep   Level 1   Watts 20   Minutes 10   Biostep-RELP   Level 3   Watts 50   Minutes 10   Prescription Details   Duration Progress to 30 minutes of continuous aerobic without signs/symptoms of physical distress    Intensity   THRR REST +  30   Ratings of Perceived Exertion 11-15   Perceived Dyspnea 2-4   Progression Continue progressive overload as per policy without signs/symptoms or physical distress.   Resistance Training   Training Prescription Yes   Weight 2   Reps 10-15      Exercise Prescription Changes:     Exercise Prescription Changes      09/09/15 1500           Response to Exercise   Blood Pressure (Admit) 112/80 mmHg       Blood Pressure (Exercise) 140/82 mmHg       Blood Pressure (Exit) 112/68 mmHg       Heart Rate (Admit) 95 bpm       Heart Rate (Exercise) 136 bpm       Heart Rate (Exit) 116 bpm       Oxygen Saturation (Admit) 97 %       Oxygen Saturation (Exercise) 99 %       Oxygen Saturation (Exit) 97 %       Rating of Perceived Exertion (Exercise) 13       Perceived Dyspnea (Exercise) 4       Resistance Training   Training Prescription Yes       Weight 4       Reps 10-12       Treadmill   MPH 3       Grade 0       Minutes 15       REL-XR   Level 2       Watts 70       Minutes 10       T5 Nustep   Level 2       Watts 40       Minutes 15          Discharge Exercise Prescription (Final Exercise Prescription Changes):     Exercise Prescription Changes - 09/09/15 1500    Response to Exercise   Blood Pressure (Admit) 112/80 mmHg   Blood Pressure (Exercise) 140/82 mmHg   Blood Pressure (Exit) 112/68  mmHg   Heart Rate (Admit) 95 bpm   Heart Rate (Exercise) 136 bpm   Heart Rate (Exit) 116 bpm   Oxygen Saturation (Admit) 97 %   Oxygen Saturation (Exercise) 99 %   Oxygen Saturation (Exit) 97 %   Rating of Perceived Exertion (Exercise) 13   Perceived Dyspnea (Exercise) 4   Resistance Training   Training Prescription Yes   Weight 4   Reps 10-12   Treadmill   MPH 3   Grade 0   Minutes 15   REL-XR   Level 2   Watts 70   Minutes 10   T5 Nustep   Level 2   Watts 40   Minutes 15       Nutrition:  Target Goals: Understanding of nutrition  guidelines, daily intake of sodium <1527m, cholesterol <2040m calories 30% from fat and 7% or less from saturated fats, daily to have 5 or more servings of fruits and vegetables.  Biometrics:      Post Biometrics - 08/20/15 1006     Post  Biometrics   Height '6\' 1"'  (1.854 m)   Weight 264 lb (119.75 kg)   Waist Circumference 46 inches   Hip Circumference 48 inches   Waist to Hip Ratio 0.96 %   BMI (Calculated) 34.9      Nutrition Therapy Plan and Nutrition Goals:     Nutrition Therapy & Goals - 08/20/15 0830    Nutrition Therapy   Diet Mr Swoveland would like to loss weight, but he is not sure he wants to meet with the dietitian. He has cut down on his portion sizes and has recently lost 8lbs.      Nutrition Discharge: Rate Your Plate Scores:   Psychosocial: Target Goals: Acknowledge presence or absence of depression, maximize coping skills, provide positive support system. Participant is able to verbalize types and ability to use techniques and skills needed for reducing stress and depression.  Initial Review & Psychosocial Screening:     Initial Psych Review & Screening - 08/20/15 0830    Family Dynamics   Good Support System? Yes   Comments Mr AlLipeas good support from his wife and children.   Barriers   Psychosocial barriers to participate in program There are no identifiable barriers or psychosocial needs.;The patient should benefit from training in stress management and relaxation.   Screening Interventions   Interventions Encouraged to exercise      Quality of Life Scores:     Quality of Life - 08/20/15 0830    Quality of Life Scores   Health/Function Pre 16.91 %   Socioeconomic Pre 28.17 %   Psych/Spiritual Pre 26.79 %   Family Pre 27.9 %   GLOBAL Pre 22.54 %      PHQ-9:     Recent Review Flowsheet Data    [facility]   Decreased Interest 0   Down, Depressed, Hopeless 0   PHQ - 2 Score 0      Psychosocial  Evaluation and Intervention:     Psychosocial Evaluation - 09/09/15 1101    Psychosocial Evaluation & Interventions   Interventions Stress management education;Relaxation education;Encouraged to exercise with the program and follow exercise prescription   Comments Counselor met with Mr. AlCairnsoday for initial psychosocial evaluation.  He is a [age]ear old gentleman who has COPD and was ordered to come into this program.  He has a strong support system with a spouse of 21 years and several adult children  close by.  Mr. Parcell is also actively involved in his local faith community.  He reports sleeping well and having a good appetite.  He states hiss mood is generally positive and denies a history or current symptoms of anxiety or depression.  Mr. Causby listed his stressors as having recently moved and his health issues currently.  He has goals to breathe better and plans to follow up working out in his home with the gym equipment he has.  Counselor encouraged him to complete this program and to participate in the psychoeducational components as well.        Psychosocial Re-Evaluation:  Education: Education Goals: Education classes will be provided on a weekly basis, covering required topics. Participant will state understanding/return demonstration of topics presented.  Learning Barriers/Preferences:     Learning Barriers/Preferences - 08/20/15 0830    Learning Barriers/Preferences   Learning Barriers None   Learning Preferences Group Instruction;Individual Instruction;Pictoral;Skilled Demonstration;Verbal Instruction;Video;Written Material      Education Topics: Initial Evaluation Education: - Verbal, written and demonstration of respiratory meds, RPE/PD scales, oximetry and breathing techniques. Instruction on use of nebulizers and MDIs: cleaning and proper use, rinsing mouth with steroid doses and importance of monitoring MDI activations.          Pulmonary Rehab from 09/09/2015 in  [facility] and Pul[facility] Re[facility]   Date  08/20/15   Educator  LB   Instruction Review Code  2- meets goals/outcomes      General Nutrition Guidelines/Fats and Fiber: -Group instruction provided by verbal, written material, models and posters to present the general guidelines for heart healthy nutrition. Gives an explanation and review of dietary fats and fiber.   Controlling Sodium/Reading Food Labels: -Group verbal and written material supporting the discussion of sodium use in heart healthy nutrition. Review and explanation with models, verbal and written materials for utilization of the food label.   Exercise Physiology & Risk Factors: - Group verbal and written instruction with models to review the exercise physiology of the cardiovascular system and associated critical values. Details cardiovascular disease risk factors and the goals associated with each risk factor.   Aerobic Exercise & Resistance Training: - Gives group verbal and written discussion on the health impact of inactivity. On the components of aerobic and resistive training programs and the benefits of this training and how to safely progress through these programs.   Flexibility, Balance, General Exercise Guidelines: - Provides group verbal and written instruction on the benefits of flexibility and balance training programs. Provides general exercise guidelines with specific guidelines to those with heart or lung disease. Demonstration and skill practice provided.   Stress Management: - Provides group verbal and written instruction about the health risks of elevated stress, cause of high stress, and healthy ways to reduce stress.   Depression: - Provides group verbal and written instruction on the correlation between heart/lung disease and depressed mood, treatment options, and the stigmas associated with seeking treatment.   Exercise & Equipment Safety: - Individual verbal instruction and demonstration of  equipment use and safety with use of the equipment.      Pulmonary Rehab from 09/09/2015 in [facility] Card[facility] and Pulmonary Rehab   Date  09/09/15   Educator  LB   Instruction Review Code  2- meets goals/outcomes      Infection Prevention: - Provides verbal and written material to individual with discussion of infection control including proper hand washing and proper equipment cleaning during exercise session.      Pulmonary Rehab  from 09/09/2015 in [facility] Pulmonary Rehab   Date  08/20/15   Educator  LB   Instruction Review Code  2- meets goals/outcomes      Falls Prevention: - Provides verbal and written material to individual with discussion of falls prevention and safety.      Pulmonary Rehab from 09/09/2015 in [facility] and Pulmonary Rehab   Date  08/20/15   Educator  LB   Instruction Review Code  2- meets goals/outcomes      Diabetes: - Individual verbal and written instruction to review signs/symptoms of diabetes, desired ranges of glucose level fasting, after meals and with exercise. Advice that pre and post exercise glucose checks will be done for 3 sessions at entry of program.      Pulmonary Rehab from 09/09/2015 in [facility] Cardiac and Pulmonary Rehab   Date  09/09/15   Educator  LB   Instruction Review Code  2- meets goals/outcomes      Chronic Lung Diseases: - Group verbal and written instruction to review new updates, new respiratory medications, new advancements in procedures and treatments. Provide informative websites and "800" numbers of self-education.   Lung Procedures: - Group verbal and written instruction to describe testing methods done to diagnose lung disease. Review the outcome of test results. Describe the treatment choices: Pulmonary Function Tests, ABGs and oximetry.   Energy Conservation: - Provide group verbal and written instruction for methods to conserve energy, plan and organize activities. Instruct on pacing techniques, use of adaptive  equipment and posture/positioning to relieve shortness of breath.   Triggers: - Group verbal and written instruction to review types of environmental controls: home humidity, furnaces, filters, dust mite/pet prevention, HEPA vacuums. To discuss weather changes, air quality and the benefits of nasal washing.      Pulmonary Rehab from 09/09/2015 in [facility] Cardiac and Pulmonary Rehab   Date  09/09/15   Educator  LB   Instruction Review Code  2- meets goals/outcomes      Exacerbations: - Group verbal and written instruction to provide: warning signs, infection symptoms, calling MD promptly, preventive modes, and value of vaccinations. Review: effective airway clearance, coughing and/or vibration techniques. Create an Sports administrator.   Oxygen: - Individual and group verbal and written instruction on oxygen therapy. Includes supplement oxygen, available portable oxygen systems, continuous and intermittent flow rates, oxygen safety, concentrators, and Medicare reimbursement for oxygen.   Respiratory Medications: - Group verbal and written instruction to review medications for lung disease. Drug class, frequency, complications, importance of spacers, rinsing mouth after steroid MDI's, and proper cleaning methods for nebulizers.      Pulmonary Rehab from 09/09/2015 in [facility] Cardiac and Pulmonary Rehab   Date  08/20/15   Educator  LB   Instruction Review Code  2- meets goals/outcomes      AED/CPR: - Group verbal and written instruction with the use of models to demonstrate the basic use of the AED with the basic ABC's of resuscitation.   Breathing Retraining: - Provides individuals verbal and written instruction on purpose, frequency, and proper technique of diaphragmatic breathing and pursed-lipped breathing. Applies individual practice skills.      Pulmonary Rehab from 09/09/2015 in [facility] Cardiac and Pulmonary Rehab   Date  08/20/15   Educator  LB   Instruction Review Code  2- meets  goals/outcomes      Anatomy and Physiology of the Lungs: - Group verbal and written instruction with the use of models to provide basic lung anatomy  and physiology related to function, structure and complications of lung disease.   Heart Failure: - Group verbal and written instruction on the basics of heart failure: signs/symptoms, treatments, explanation of ejection fraction, enlarged heart and cardiomyopathy.   Sleep Apnea: - Individual verbal and written instruction to review Obstructive Sleep Apnea. Review of risk factors, methods for diagnosing and types of masks and machines for OSA.   Anxiety: - Provides group, verbal and written instruction on the correlation between heart/lung disease and anxiety, treatment options, and management of anxiety.   Relaxation: - Provides group, verbal and written instruction about the benefits of relaxation for patients with heart/lung disease. Also provides patients with examples of relaxation techniques.   Knowledge Questionnaire Score:     Knowledge Questionnaire Score - 08/20/15 0830    Knowledge Questionnaire Score   Pre Score -3      Personal Goals and Risk Factors at Admission:     Personal Goals and Risk Factors at Admission - 08/20/15 0830    Personal Goals and Risk Factors on Admission    Weight Management Yes   Intervention Learn and follow the exercise and diet guidelines while in the program. Utilize the nutrition and education classes to help gain knowledge of the diet and exercise expectations in the program  Mr Frerking would like to loss weight, but he is not sure he wants to meet with the dietitian. He has cut down on his portion sizes and has recently lost 8lbs.   Admit Weight 264 lb (119.75 kg)   Goal Weight 200 lb (90.719 kg)   Increase Aerobic Exercise and Physical Activity Yes   Intervention While in program, learn and follow the exercise prescription taught. Start at a low level workload and increase workload  after able to maintain previous level for 30 minutes. Increase time before increasing intensity.  Mr Aeschliman would like to increase his exercise capacity. He has a home treadmill, stationary bike, and weights.   Understand more about Heart/Pulmonary Disease. Yes   Intervention While in program utilize professionals for any questions, and attend the education sessions. Great websites to use are www.americanheart.org or www.lung.org for reliable information.  Ms Aughenbaugh has Mild COPD, Asthma, and OSA and is very interested in learning new knowledge of these diseases and how to manage them.   Improve shortness of breath with ADL's Yes   Intervention While in program, learn and follow the exercise prescription taught. Start at a low level workload and increase workload ad advised by the exercise physiologist. Increase time before increasing intensity.  Mr Shroff goes at thing "all out" and does experience shortness of breath. He will benefit from supervised exercise, pacing , and PLB.   Develop more efficient breathing techniques such as purse lipped breathing and diaphragmatic breathing; and practicing self-pacing with activity Yes   Intervention While in program, learn and utilize the specific breathing techniques taught to you. Continue to practice and use the techniques as needed.  Demonstrated good technique and understanding of PLBing   Increase knowledge of respiratory medications and ability to use respiratory devices properly.  Yes   Intervention While in program, learn to administer MDI, nebulizer, and spacer properly.;Learn to take respiratory medicine as ordered.;While in program, learn to Clean MDI, nebulizers, and spacers properly.  Mr Hartsough takes Spiriva, Symbicort, and Proventil MDI. Educated on these inhalers and gave Mr Berninger a spacer. Mr Zeman is also on CPAP, full mask, provided by the [place].   Diabetes Yes   Goal Blood  glucose control identified by blood glucose values, HgbA1C.  Participant verbalizes understanding of the signs/symptoms of hyper/hypo glycemia, proper foot care and importance of medication and nutrition plan for blood glucose control.   Intervention Provide nutrition & aerobic exercise along with prescribed medications to achieve blood glucose in normal ranges: Fasting 65-99 mg/dL   Hypertension Yes   Goal Participant will see blood pressure controlled within the values of 140/54m/Hg or within value directed by their physician.   Intervention Provide nutrition & aerobic exercise along with prescribed medications to achieve BP 140/90 or less.      Personal Goals and Risk Factors Review:    Personal Goals Discharge (Final Personal Goals and Risk Factors Review):    ITP Comments:     ITP Comments      09/09/15 1110           ITP Comments Exercise and personal goals anticipated to be met in 20 more sessions.          Comments: Due to a high co-pay, [place] [facility] [place] and will start back when the approval goes through. 30 day note review.

## 2015-09-25 ENCOUNTER — Encounter: Payer: No Typology Code available for payment source | Attending: Specialist

## 2015-09-25 DIAGNOSIS — J449 Chronic obstructive pulmonary disease, unspecified: Secondary | ICD-10-CM | POA: Insufficient documentation

## 2015-10-08 ENCOUNTER — Telehealth: Payer: Self-pay | Admitting: Respiratory Therapy

## 2015-10-08 NOTE — Telephone Encounter (Signed)
Called Adrian Harris - [facility] and has been approved by the [place] to participate in [place], but they still have not sent the paperwork. He is going to call the [place] again tomorrow.

## 2015-10-14 ENCOUNTER — Encounter: Payer: No Typology Code available for payment source | Admitting: Respiratory Therapy

## 2015-10-14 ENCOUNTER — Encounter: Payer: Self-pay | Admitting: *Deleted

## 2015-10-14 DIAGNOSIS — J449 Chronic obstructive pulmonary disease, unspecified: Secondary | ICD-10-CM

## 2015-10-14 DIAGNOSIS — J4520 Mild intermittent asthma, uncomplicated: Secondary | ICD-10-CM

## 2015-10-14 DIAGNOSIS — G4730 Sleep apnea, unspecified: Secondary | ICD-10-CM

## 2015-10-14 NOTE — Progress Notes (Signed)
Pulmonary Individual Treatment Plan  Patient Details  Name: Adrian Harris. MRN: [id number] Date of Birth: 08-18-1951 [facility]:  [facility]  Initial Encounter Date:  08/20/15  Visit Diagnosis: COPD, mild ([place])  Asthma, mild intermittent, uncomplicated  Sleep apnea  Patient's Home Medications on Admission:  Current outpatient prescriptions:  .  albuterol (PROAIR HFA) 108 (90 BASE) MCG/ACT inhaler, Inhale into the lungs., Disp: , Rfl:  .  budesonide-formoterol (SYMBICORT) 160-4.5 MCG/ACT inhaler, Inhale into the lungs., Disp: , Rfl:  .  Cholecalciferol (VITAMIN D-1000 MAX ST) 1000 UNITS tablet, Take by mouth., Disp: , Rfl:  .  glucose blood (ACCU-CHEK COMFORT CURVE) test strip, , Disp: , Rfl:  .  lisinopril (PRINIVIL,ZESTRIL) 20 MG tablet, Take by mouth., Disp: , Rfl:  .  metFORMIN (GLUCOPHAGE-XR) 500 MG 24 hr tablet, Take by mouth., Disp: , Rfl:  .  mometasone (NASONEX) 50 MCG/ACT nasal spray, , Disp: , Rfl:  .  tiotropium (SPIRIVA) 18 MCG inhalation capsule, Place into inhaler and inhale., Disp: , Rfl:  .  vitamin B-12 (CYANOCOBALAMIN) 1000 MCG tablet, Take by mouth., Disp: , Rfl:   Past Medical History: No past medical history on file.  Tobacco Use: History  Smoking status  . Former Smoker -- 2.00 packs/day for 9 years  . Types: Cigarettes  Smokeless tobacco  . Former Systems developer  . Quit date: 11/11/1974    Labs: Recent Review Flowsheet Data    There is no flowsheet data to display.       ADL UCSD:     ADL UCSD      08/20/15 0830       ADL UCSD   ADL Phase Entry     SOB Score total 34     Rest 0     Walk 1     Stairs 2     Bath 0     Dress 0     Shop 2         Pulmonary Function Assessment:     Pulmonary Function Assessment - 08/20/15 0830    Pulmonary Function Tests   RV% 74 %   DLCO% 94 %   Initial Spirometry Results   FVC% 94 %   FEV1% 81 %   FEV1/FVC Ratio 68   Breath   Bilateral Breath Sounds Clear   Shortness of Breath Yes;Limiting  activity      Exercise Target Goals:    Exercise Program Goal: Individual exercise prescription set with THRR, safety & activity barriers. Participant demonstrates ability to understand and report RPE using BORG scale, to self-measure pulse accurately, and to acknowledge the importance of the exercise prescription.  Exercise Prescription Goal: Starting with aerobic activity 30 plus minutes a day, 3 days per week for initial exercise prescription. Provide home exercise prescription and guidelines that participant acknowledges understanding prior to discharge.  Activity Barriers & Risk Stratification:     Activity Barriers & Cardiac Risk Stratification - 08/20/15 0830    Activity Barriers & Cardiac Risk Stratification   Activity Barriers Shortness of Breath;Arthritis;Deconditioning;Muscular Weakness   Cardiac Risk Stratification Moderate      6 Minute Walk:     6 Minute Walk      08/20/15 1004 09/16/15 1022     6 Minute Walk   Phase Initial Mid Program    Distance 1700 feet 1000 feet    Walk Time 6 minutes 6 minutes    RPE 12 13    Perceived Dyspnea  3 3  Symptoms No No    Resting HR 74 bpm 74 bpm    Resting BP 118/74 mmHg 124/64 mmHg    Max Ex. HR 117 bpm 82 bpm    Max Ex. BP 144/76 mmHg 142/74 mmHg       Initial Exercise Prescription:     Initial Exercise Prescription - 08/20/15 1000    Date of Initial Exercise Prescription   Date 08/20/15   Treadmill   MPH 2.5   Grade 0   Minutes 10   Recumbant Bike   Level 2   RPM 40   Watts 20   Minutes 10   NuStep   Level 2   Watts 50   Minutes 10   Arm Ergometer   Level 1   Watts 10   Minutes 10   Recumbant Elliptical   Level 2   RPM 40   Watts 20   Minutes 10   Elliptical   Level 1   Speed 3   Minutes 1   REL-XR   Level 3   Watts 50   Minutes 10   T5 Nustep   Level 1   Watts 20   Minutes 10   Biostep-RELP   Level 3   Watts 50   Minutes 10   Prescription Details   Duration Progress to 30  minutes of continuous aerobic without signs/symptoms of physical distress   Intensity   THRR REST +  30   Ratings of Perceived Exertion 11-15   Perceived Dyspnea 2-4   Progression Continue progressive overload as per policy without signs/symptoms or physical distress.   Resistance Training   Training Prescription Yes   Weight 2   Reps 10-15      Exercise Prescription Changes:     Exercise Prescription Changes      09/09/15 1500           Response to Exercise   Blood Pressure (Admit) 112/80 mmHg       Blood Pressure (Exercise) 140/82 mmHg       Blood Pressure (Exit) 112/68 mmHg       Heart Rate (Admit) 95 bpm       Heart Rate (Exercise) 136 bpm       Heart Rate (Exit) 116 bpm       Oxygen Saturation (Admit) 97 %       Oxygen Saturation (Exercise) 99 %       Oxygen Saturation (Exit) 97 %       Rating of Perceived Exertion (Exercise) 13       Perceived Dyspnea (Exercise) 4       Resistance Training   Training Prescription Yes       Weight 4       Reps 10-12       Treadmill   MPH 3       Grade 0       Minutes 15       REL-XR   Level 2       Watts 70       Minutes 10       T5 Nustep   Level 2       Watts 40       Minutes 15          Discharge Exercise Prescription (Final Exercise Prescription Changes):     Exercise Prescription Changes - 09/09/15 1500    Response to Exercise   Blood Pressure (Admit) 112/80 mmHg   Blood Pressure (Exercise) 140/82  mmHg   Blood Pressure (Exit) 112/68 mmHg   Heart Rate (Admit) 95 bpm   Heart Rate (Exercise) 136 bpm   Heart Rate (Exit) 116 bpm   Oxygen Saturation (Admit) 97 %   Oxygen Saturation (Exercise) 99 %   Oxygen Saturation (Exit) 97 %   Rating of Perceived Exertion (Exercise) 13   Perceived Dyspnea (Exercise) 4   Resistance Training   Training Prescription Yes   Weight 4   Reps 10-12   Treadmill   MPH 3   Grade 0   Minutes 15   REL-XR   Level 2   Watts 70   Minutes 10   T5 Nustep   Level 2   Watts 40    Minutes 15       Nutrition:  Target Goals: Understanding of nutrition guidelines, daily intake of sodium <1510m, cholesterol <2039m calories 30% from fat and 7% or less from saturated fats, daily to have 5 or more servings of fruits and vegetables.  Biometrics:      Post Biometrics - 08/20/15 1006     Post  Biometrics   Height '6\' 1"'  (1.854 m)   Weight 264 lb (119.75 kg)   Waist Circumference 46 inches   Hip Circumference 48 inches   Waist to Hip Ratio 0.96 %   BMI (Calculated) 34.9      Nutrition Therapy Plan and Nutrition Goals:     Nutrition Therapy & Goals - 08/20/15 0830    Nutrition Therapy   Diet Mr Wildasin would like to loss weight, but he is not sure he wants to meet with the dietitian. He has cut down on his portion sizes and has recently lost 8lbs.      Nutrition Discharge: Rate Your Plate Scores:   Psychosocial: Target Goals: Acknowledge presence or absence of depression, maximize coping skills, provide positive support system. Participant is able to verbalize types and ability to use techniques and skills needed for reducing stress and depression.  Initial Review & Psychosocial Screening:     Initial Psych Review & Screening - 08/20/15 0830    Family Dynamics   Good Support System? Yes   Comments Mr AlMarkovitzas good support from his wife and children.   Barriers   Psychosocial barriers to participate in program There are no identifiable barriers or psychosocial needs.;The patient should benefit from training in stress management and relaxation.   Screening Interventions   Interventions Encouraged to exercise      Quality of Life Scores:     Quality of Life - 08/20/15 0830    Quality of Life Scores   Health/Function Pre 16.91 %   Socioeconomic Pre 28.17 %   Psych/Spiritual Pre 26.79 %   Family Pre 27.9 %   GLOBAL Pre 22.54 %      PHQ-9:     Recent Review Flowsheet Data    [facility]   Decreased Interest 0    Down, Depressed, Hopeless 0   PHQ - 2 Score 0      Psychosocial Evaluation and Intervention:     Psychosocial Evaluation - 09/09/15 1101    Psychosocial Evaluation & Interventions   Interventions Stress management education;Relaxation education;Encouraged to exercise with the program and follow exercise prescription   Comments Counselor met with Mr. AlKasseloday for initial psychosocial evaluation.  He is a [age]ear old gentleman who has COPD and was ordered to come into this program.  He has a strong support system with a spouse  of 21 years and several adult children close by.  Mr. Shin is also actively involved in his local faith community.  He reports sleeping well and having a good appetite.  He states hiss mood is generally positive and denies a history or current symptoms of anxiety or depression.  Mr. Furtick listed his stressors as having recently moved and his health issues currently.  He has goals to breathe better and plans to follow up working out in his home with the gym equipment he has.  Counselor encouraged him to complete this program and to participate in the psychoeducational components as well.        Psychosocial Re-Evaluation:  Education: Education Goals: Education classes will be provided on a weekly basis, covering required topics. Participant will state understanding/return demonstration of topics presented.  Learning Barriers/Preferences:     Learning Barriers/Preferences - 08/20/15 0830    Learning Barriers/Preferences   Learning Barriers None   Learning Preferences Group Instruction;Individual Instruction;Pictoral;Skilled Demonstration;Verbal Instruction;Video;Written Material      Education Topics: Initial Evaluation Education: - Verbal, written and demonstration of respiratory meds, RPE/PD scales, oximetry and breathing techniques. Instruction on use of nebulizers and MDIs: cleaning and proper use, rinsing mouth with steroid doses and importance of  monitoring MDI activations.          Pulmonary Rehab from 09/09/2015 in [facility] Cardiac and Pulmonary Rehab   Date  08/20/15   Educator  LB   Instruction Review Code  2- meets goals/outcomes      General Nutrition Guidelines/Fats and Fiber: -Group instruction provided by verbal, written material, models and posters to present the general guidelines for heart healthy nutrition. Gives an explanation and review of dietary fats and fiber.   Controlling Sodium/Reading Food Labels: -Group verbal and written material supporting the discussion of sodium use in heart healthy nutrition. Review and explanation with models, verbal and written materials for utilization of the food label.   Exercise Physiology & Risk Factors: - Group verbal and written instruction with models to review the exercise physiology of the cardiovascular system and associated critical values. Details cardiovascular disease risk factors and the goals associated with each risk factor.   Aerobic Exercise & Resistance Training: - Gives group verbal and written discussion on the health impact of inactivity. On the components of aerobic and resistive training programs and the benefits of this training and how to safely progress through these programs.   Flexibility, Balance, General Exercise Guidelines: - Provides group verbal and written instruction on the benefits of flexibility and balance training programs. Provides general exercise guidelines with specific guidelines to those with heart or lung disease. Demonstration and skill practice provided.   Stress Management: - Provides group verbal and written instruction about the health risks of elevated stress, cause of high stress, and healthy ways to reduce stress.   Depression: - Provides group verbal and written instruction on the correlation between heart/lung disease and depressed mood, treatment options, and the stigmas associated with seeking treatment.   Exercise &  Equipment Safety: - Individual verbal instruction and demonstration of equipment use and safety with use of the equipment.      Pulmonary Rehab from 09/09/2015 in [facility] Cardiac and Pulmonary Rehab   Date  09/09/15   Educator  LB   Instruction Review Code  2- meets goals/outcomes      Infection Prevention: - Provides verbal and written material to individual with discussion of infection control including proper hand washing and proper equipment cleaning during exercise session.  Pulmonary Rehab from 09/09/2015 in [facility] Cardiac and Pulmonary Rehab   Date  08/20/15   Educator  LB   Instruction Review Code  2- meets goals/outcomes      Falls Prevention: - Provides verbal and written material to individual with discussion of falls prevention and safety.      Pulmonary Rehab from 09/09/2015 in [facility] Cardiac and Pulmonary Rehab   Date  08/20/15   Educator  LB   Instruction Review Code  2- meets goals/outcomes      Diabetes: - Individual verbal and written instruction to review signs/symptoms of diabetes, desired ranges of glucose level fasting, after meals and with exercise. Advice that pre and post exercise glucose checks will be done for 3 sessions at entry of program.      Pulmonary Rehab from 09/09/2015 in [facility] Cardiac and Pulmonary Rehab   Date  09/09/15   Educator  LB   Instruction Review Code  2- meets goals/outcomes      Chronic Lung Diseases: - Group verbal and written instruction to review new updates, new respiratory medications, new advancements in procedures and treatments. Provide informative websites and "800" numbers of self-education.   Lung Procedures: - Group verbal and written instruction to describe testing methods done to diagnose lung disease. Review the outcome of test results. Describe the treatment choices: Pulmonary Function Tests, ABGs and oximetry.   Energy Conservation: - Provide group verbal and written instruction for methods to conserve energy, plan  and organize activities. Instruct on pacing techniques, use of adaptive equipment and posture/positioning to relieve shortness of breath.   Triggers: - Group verbal and written instruction to review types of environmental controls: home humidity, furnaces, filters, dust mite/pet prevention, HEPA vacuums. To discuss weather changes, air quality and the benefits of nasal washing.      Pulmonary Rehab from 09/09/2015 in [facility] Cardiac and Pulmonary Rehab   Date  09/09/15   Educator  LB   Instruction Review Code  2- meets goals/outcomes      Exacerbations: - Group verbal and written instruction to provide: warning signs, infection symptoms, calling MD promptly, preventive modes, and value of vaccinations. Review: effective airway clearance, coughing and/or vibration techniques. Create an Sports administrator.   Oxygen: - Individual and group verbal and written instruction on oxygen therapy. Includes supplement oxygen, available portable oxygen systems, continuous and intermittent flow rates, oxygen safety, concentrators, and Medicare reimbursement for oxygen.   Respiratory Medications: - Group verbal and written instruction to review medications for lung disease. Drug class, frequency, complications, importance of spacers, rinsing mouth after steroid MDI's, and proper cleaning methods for nebulizers.      Pulmonary Rehab from 09/09/2015 in [facility] Cardiac and Pulmonary Rehab   Date  08/20/15   Educator  LB   Instruction Review Code  2- meets goals/outcomes      AED/CPR: - Group verbal and written instruction with the use of models to demonstrate the basic use of the AED with the basic ABC's of resuscitation.   Breathing Retraining: - Provides individuals verbal and written instruction on purpose, frequency, and proper technique of diaphragmatic breathing and pursed-lipped breathing. Applies individual practice skills.      Pulmonary Rehab from 09/09/2015 in [facility] Cardiac and Pulmonary Rehab   Date   08/20/15   Educator  LB   Instruction Review Code  2- meets goals/outcomes      Anatomy and Physiology of the Lungs: - Group verbal and written instruction with the use of models to provide basic  lung anatomy and physiology related to function, structure and complications of lung disease.   Heart Failure: - Group verbal and written instruction on the basics of heart failure: signs/symptoms, treatments, explanation of ejection fraction, enlarged heart and cardiomyopathy.   Sleep Apnea: - Individual verbal and written instruction to review Obstructive Sleep Apnea. Review of risk factors, methods for diagnosing and types of masks and machines for OSA.   Anxiety: - Provides group, verbal and written instruction on the correlation between heart/lung disease and anxiety, treatment options, and management of anxiety.   Relaxation: - Provides group, verbal and written instruction about the benefits of relaxation for patients with heart/lung disease. Also provides patients with examples of relaxation techniques.   Knowledge Questionnaire Score:     Knowledge Questionnaire Score - 08/20/15 0830    Knowledge Questionnaire Score   Pre Score -3      Personal Goals and Risk Factors at Admission:     Personal Goals and Risk Factors at Admission - 08/20/15 0830    Personal Goals and Risk Factors on Admission    Weight Management Yes   Intervention Learn and follow the exercise and diet guidelines while in the program. Utilize the nutrition and education classes to help gain knowledge of the diet and exercise expectations in the program  Mr Furches would like to loss weight, but he is not sure he wants to meet with the dietitian. He has cut down on his portion sizes and has recently lost 8lbs.   Admit Weight 264 lb (119.75 kg)   Goal Weight 200 lb (90.719 kg)   Increase Aerobic Exercise and Physical Activity Yes   Intervention While in program, learn and follow the exercise prescription  taught. Start at a low level workload and increase workload after able to maintain previous level for 30 minutes. Increase time before increasing intensity.  Mr Shewmake would like to increase his exercise capacity. He has a home treadmill, stationary bike, and weights.   Understand more about Heart/Pulmonary Disease. Yes   Intervention While in program utilize professionals for any questions, and attend the education sessions. Great websites to use are www.americanheart.org or www.lung.org for reliable information.  Ms Brothers has Mild COPD, Asthma, and OSA and is very interested in learning new knowledge of these diseases and how to manage them.   Improve shortness of breath with ADL's Yes   Intervention While in program, learn and follow the exercise prescription taught. Start at a low level workload and increase workload ad advised by the exercise physiologist. Increase time before increasing intensity.  Mr Falin goes at thing "all out" and does experience shortness of breath. He will benefit from supervised exercise, pacing , and PLB.   Develop more efficient breathing techniques such as purse lipped breathing and diaphragmatic breathing; and practicing self-pacing with activity Yes   Intervention While in program, learn and utilize the specific breathing techniques taught to you. Continue to practice and use the techniques as needed.  Demonstrated good technique and understanding of PLBing   Increase knowledge of respiratory medications and ability to use respiratory devices properly.  Yes   Intervention While in program, learn to administer MDI, nebulizer, and spacer properly.;Learn to take respiratory medicine as ordered.;While in program, learn to Clean MDI, nebulizers, and spacers properly.  Mr Denk takes Spiriva, Symbicort, and Proventil MDI. Educated on these inhalers and gave Mr Saha a spacer. Mr Drawdy is also on CPAP, full mask, provided by the [place].   Diabetes Yes  Goal Blood glucose  control identified by blood glucose values, HgbA1C. Participant verbalizes understanding of the signs/symptoms of hyper/hypo glycemia, proper foot care and importance of medication and nutrition plan for blood glucose control.   Intervention Provide nutrition & aerobic exercise along with prescribed medications to achieve blood glucose in normal ranges: Fasting 65-99 mg/dL   Hypertension Yes   Goal Participant will see blood pressure controlled within the values of 140/49m/Hg or within value directed by their physician.   Intervention Provide nutrition & aerobic exercise along with prescribed medications to achieve BP 140/90 or less.      Personal Goals and Risk Factors Review:    Personal Goals Discharge (Final Personal Goals and Risk Factors Review):    ITP Comments:     ITP Comments      09/09/15 1110           ITP Comments Exercise and personal goals anticipated to be met in 20 more sessions.          Comments: 30 Day Review

## 2015-10-14 NOTE — Progress Notes (Signed)
Pulmonary Individual Treatment Plan  Patient Details  Name: Adrian Harris. MRN: [id number] Date of Birth: 1951-01-02 [facility] Provider:  [facility]  Initial Encounter Date: 08/20/2015  Visit Diagnosis: COPD, mild ([place])  Asthma, mild intermittent, uncomplicated  Sleep apnea  Patient's Home Medications on Admission:  Current outpatient prescriptions:    albuterol (PROAIR HFA) 108 (90 BASE) MCG/ACT inhaler, Inhale into the lungs., Disp: , Rfl:    budesonide-formoterol (SYMBICORT) 160-4.5 MCG/ACT inhaler, Inhale into the lungs., Disp: , Rfl:    Cholecalciferol (VITAMIN D-1000 MAX ST) 1000 UNITS tablet, Take by mouth., Disp: , Rfl:    glucose blood (ACCU-CHEK COMFORT CURVE) test strip, , Disp: , Rfl:    lisinopril (PRINIVIL,ZESTRIL) 20 MG tablet, Take by mouth., Disp: , Rfl:    metFORMIN (GLUCOPHAGE-XR) 500 MG 24 hr tablet, Take by mouth., Disp: , Rfl:    mometasone (NASONEX) 50 MCG/ACT nasal spray, , Disp: , Rfl:    tiotropium (SPIRIVA) 18 MCG inhalation capsule, Place into inhaler and inhale., Disp: , Rfl:    vitamin B-12 (CYANOCOBALAMIN) 1000 MCG tablet, Take by mouth., Disp: , Rfl:   Past Medical History: No past medical history on file.  Tobacco Use: History  Smoking status   Former Smoker -- 2.00 packs/day for 9 years   Types: Cigarettes  Smokeless tobacco   Former Systems developer   Quit date: 11/11/1974    Labs: Recent Review Flowsheet Data    There is no flowsheet data to display.       ADL UCSD:     ADL UCSD      08/20/15 0830       ADL UCSD   ADL Phase Entry     SOB Score total 34     Rest 0     Walk 1     Stairs 2     Bath 0     Dress 0     Shop 2         Pulmonary Function Assessment:     Pulmonary Function Assessment - 08/20/15 0830    Pulmonary Function Tests   RV% 74 %   DLCO% 94 %   Initial Spirometry Results   FVC% 94 %   FEV1% 81 %   FEV1/FVC Ratio 68   Breath   Bilateral Breath Sounds Clear   Shortness of Breath Yes;Limiting  activity      Exercise Target Goals:    Exercise Program Goal: Individual exercise prescription set with THRR, safety & activity barriers. Participant demonstrates ability to understand and report RPE using BORG scale, to self-measure pulse accurately, and to acknowledge the importance of the exercise prescription.  Exercise Prescription Goal: Starting with aerobic activity 30 plus minutes a day, 3 days per week for initial exercise prescription. Provide home exercise prescription and guidelines that participant acknowledges understanding prior to discharge.  Activity Barriers & Risk Stratification:     Activity Barriers & Cardiac Risk Stratification - 08/20/15 0830    Activity Barriers & Cardiac Risk Stratification   Activity Barriers Shortness of Breath;Arthritis;Deconditioning;Muscular Weakness   Cardiac Risk Stratification Moderate      6 Minute Walk:     6 Minute Walk      08/20/15 1004 09/16/15 1022     6 Minute Walk   Phase Initial Mid Program    Distance 1700 feet 1000 feet    Walk Time 6 minutes 6 minutes    RPE 12 13    Perceived Dyspnea  3 3  Symptoms No No    Resting HR 74 bpm 74 bpm    Resting BP 118/74 mmHg 124/64 mmHg    Max Ex. HR 117 bpm 82 bpm    Max Ex. BP 144/76 mmHg 142/74 mmHg       Initial Exercise Prescription:     Initial Exercise Prescription - 08/20/15 1000    Date of Initial Exercise Prescription   Date 08/20/15   Treadmill   MPH 2.5   Grade 0   Minutes 10   Recumbant Bike   Level 2   RPM 40   Watts 20   Minutes 10   NuStep   Level 2   Watts 50   Minutes 10   Arm Ergometer   Level 1   Watts 10   Minutes 10   Recumbant Elliptical   Level 2   RPM 40   Watts 20   Minutes 10   Elliptical   Level 1   Speed 3   Minutes 1   REL-XR   Level 3   Watts 50   Minutes 10   T5 Nustep   Level 1   Watts 20   Minutes 10   Biostep-RELP   Level 3   Watts 50   Minutes 10   Prescription Details   Duration Progress to 30  minutes of continuous aerobic without signs/symptoms of physical distress   Intensity   THRR REST +  30   Ratings of Perceived Exertion 11-15   Perceived Dyspnea 2-4   Progression Continue progressive overload as per policy without signs/symptoms or physical distress.   Resistance Training   Training Prescription Yes   Weight 2   Reps 10-15      Exercise Prescription Changes:     Exercise Prescription Changes      09/09/15 1500           Response to Exercise   Blood Pressure (Admit) 112/80 mmHg       Blood Pressure (Exercise) 140/82 mmHg       Blood Pressure (Exit) 112/68 mmHg       Heart Rate (Admit) 95 bpm       Heart Rate (Exercise) 136 bpm       Heart Rate (Exit) 116 bpm       Oxygen Saturation (Admit) 97 %       Oxygen Saturation (Exercise) 99 %       Oxygen Saturation (Exit) 97 %       Rating of Perceived Exertion (Exercise) 13       Perceived Dyspnea (Exercise) 4       Resistance Training   Training Prescription Yes       Weight 4       Reps 10-12       Treadmill   MPH 3       Grade 0       Minutes 15       REL-XR   Level 2       Watts 70       Minutes 10       T5 Nustep   Level 2       Watts 40       Minutes 15          Discharge Exercise Prescription (Final Exercise Prescription Changes):     Exercise Prescription Changes - 09/09/15 1500    Response to Exercise   Blood Pressure (Admit) 112/80 mmHg   Blood Pressure (Exercise) 140/82  mmHg   Blood Pressure (Exit) 112/68 mmHg   Heart Rate (Admit) 95 bpm   Heart Rate (Exercise) 136 bpm   Heart Rate (Exit) 116 bpm   Oxygen Saturation (Admit) 97 %   Oxygen Saturation (Exercise) 99 %   Oxygen Saturation (Exit) 97 %   Rating of Perceived Exertion (Exercise) 13   Perceived Dyspnea (Exercise) 4   Resistance Training   Training Prescription Yes   Weight 4   Reps 10-12   Treadmill   MPH 3   Grade 0   Minutes 15   REL-XR   Level 2   Watts 70   Minutes 10   T5 Nustep   Level 2   Watts 40    Minutes 15       Nutrition:  Target Goals: Understanding of nutrition guidelines, daily intake of sodium <1576m, cholesterol <2036m calories 30% from fat and 7% or less from saturated fats, daily to have 5 or more servings of fruits and vegetables.  Biometrics:      Post Biometrics - 08/20/15 1006     Post  Biometrics   Height '6\' 1"'  (1.854 m)   Weight 264 lb (119.75 kg)   Waist Circumference 46 inches   Hip Circumference 48 inches   Waist to Hip Ratio 0.96 %   BMI (Calculated) 34.9      Nutrition Therapy Plan and Nutrition Goals:     Nutrition Therapy & Goals - 08/20/15 0830    Nutrition Therapy   Diet Mr Seefeld would like to loss weight, but he is not sure he wants to meet with the dietitian. He has cut down on his portion sizes and has recently lost 8lbs.      Nutrition Discharge: Rate Your Plate Scores:   Psychosocial: Target Goals: Acknowledge presence or absence of depression, maximize coping skills, provide positive support system. Participant is able to verbalize types and ability to use techniques and skills needed for reducing stress and depression.  Initial Review & Psychosocial Screening:     Initial Psych Review & Screening - 08/20/15 0830    Family Dynamics   Good Support System? Yes   Comments Mr AlWickliffas good support from his wife and children.   Barriers   Psychosocial barriers to participate in program There are no identifiable barriers or psychosocial needs.;The patient should benefit from training in stress management and relaxation.   Screening Interventions   Interventions Encouraged to exercise      Quality of Life Scores:     Quality of Life - 08/20/15 0830    Quality of Life Scores   Health/Function Pre 16.91 %   Socioeconomic Pre 28.17 %   Psych/Spiritual Pre 26.79 %   Family Pre 27.9 %   GLOBAL Pre 22.54 %      PHQ-9:     Recent Review Flowsheet Data    [facility]   Decreased Interest 0    Down, Depressed, Hopeless 0   PHQ - 2 Score 0      Psychosocial Evaluation and Intervention:     Psychosocial Evaluation - 09/09/15 1101    Psychosocial Evaluation & Interventions   Interventions Stress management education;Relaxation education;Encouraged to exercise with the program and follow exercise prescription   Comments Counselor met with Mr. AlGairoday for initial psychosocial evaluation.  He is a [age]ear old gentleman who has COPD and was ordered to come into this program.  He has a strong support system with a spouse  of 21 years and several adult children close by.  Mr. Seever is also actively involved in his local faith community.  He reports sleeping well and having a good appetite.  He states hiss mood is generally positive and denies a history or current symptoms of anxiety or depression.  Mr. Mussa listed his stressors as having recently moved and his health issues currently.  He has goals to breathe better and plans to follow up working out in his home with the gym equipment he has.  Counselor encouraged him to complete this program and to participate in the psychoeducational components as well.        Psychosocial Re-Evaluation:  Education: Education Goals: Education classes will be provided on a weekly basis, covering required topics. Participant will state understanding/return demonstration of topics presented.  Learning Barriers/Preferences:     Learning Barriers/Preferences - 08/20/15 0830    Learning Barriers/Preferences   Learning Barriers None   Learning Preferences Group Instruction;Individual Instruction;Pictoral;Skilled Demonstration;Verbal Instruction;Video;Written Material      Education Topics: Initial Evaluation Education: - Verbal, written and demonstration of respiratory meds, RPE/PD scales, oximetry and breathing techniques. Instruction on use of nebulizers and MDIs: cleaning and proper use, rinsing mouth with steroid doses and importance of  monitoring MDI activations.          Pulmonary Rehab from 09/09/2015 in [facility] Cardiac and Pulmonary Rehab   Date  08/20/15   Educator  LB   Instruction Review Code  2- meets goals/outcomes      General Nutrition Guidelines/Fats and Fiber: -Group instruction provided by verbal, written material, models and posters to present the general guidelines for heart healthy nutrition. Gives an explanation and review of dietary fats and fiber.   Controlling Sodium/Reading Food Labels: -Group verbal and written material supporting the discussion of sodium use in heart healthy nutrition. Review and explanation with models, verbal and written materials for utilization of the food label.   Exercise Physiology & Risk Factors: - Group verbal and written instruction with models to review the exercise physiology of the cardiovascular system and associated critical values. Details cardiovascular disease risk factors and the goals associated with each risk factor.   Aerobic Exercise & Resistance Training: - Gives group verbal and written discussion on the health impact of inactivity. On the components of aerobic and resistive training programs and the benefits of this training and how to safely progress through these programs.   Flexibility, Balance, General Exercise Guidelines: - Provides group verbal and written instruction on the benefits of flexibility and balance training programs. Provides general exercise guidelines with specific guidelines to those with heart or lung disease. Demonstration and skill practice provided.   Stress Management: - Provides group verbal and written instruction about the health risks of elevated stress, cause of high stress, and healthy ways to reduce stress.   Depression: - Provides group verbal and written instruction on the correlation between heart/lung disease and depressed mood, treatment options, and the stigmas associated with seeking treatment.   Exercise &  Equipment Safety: - Individual verbal instruction and demonstration of equipment use and safety with use of the equipment.      Pulmonary Rehab from 09/09/2015 in [facility] Cardiac and Pulmonary Rehab   Date  09/09/15   Educator  LB   Instruction Review Code  2- meets goals/outcomes      Infection Prevention: - Provides verbal and written material to individual with discussion of infection control including proper hand washing and proper equipment cleaning during exercise session.  Pulmonary Rehab from 09/09/2015 in [facility] Cardiac and Pulmonary Rehab   Date  08/20/15   Educator  LB   Instruction Review Code  2- meets goals/outcomes      Falls Prevention: - Provides verbal and written material to individual with discussion of falls prevention and safety.      Pulmonary Rehab from 09/09/2015 in [facility] Cardiac and Pulmonary Rehab   Date  08/20/15   Educator  LB   Instruction Review Code  2- meets goals/outcomes      Diabetes: - Individual verbal and written instruction to review signs/symptoms of diabetes, desired ranges of glucose level fasting, after meals and with exercise. Advice that pre and post exercise glucose checks will be done for 3 sessions at entry of program.      Pulmonary Rehab from 09/09/2015 in [facility] Cardiac and Pulmonary Rehab   Date  09/09/15   Educator  LB   Instruction Review Code  2- meets goals/outcomes      Chronic Lung Diseases: - Group verbal and written instruction to review new updates, new respiratory medications, new advancements in procedures and treatments. Provide informative websites and "800" numbers of self-education.   Lung Procedures: - Group verbal and written instruction to describe testing methods done to diagnose lung disease. Review the outcome of test results. Describe the treatment choices: Pulmonary Function Tests, ABGs and oximetry.   Energy Conservation: - Provide group verbal and written instruction for methods to conserve energy, plan  and organize activities. Instruct on pacing techniques, use of adaptive equipment and posture/positioning to relieve shortness of breath.   Triggers: - Group verbal and written instruction to review types of environmental controls: home humidity, furnaces, filters, dust mite/pet prevention, HEPA vacuums. To discuss weather changes, air quality and the benefits of nasal washing.      Pulmonary Rehab from 09/09/2015 in [facility] Cardiac and Pulmonary Rehab   Date  09/09/15   Educator  LB   Instruction Review Code  2- meets goals/outcomes      Exacerbations: - Group verbal and written instruction to provide: warning signs, infection symptoms, calling MD promptly, preventive modes, and value of vaccinations. Review: effective airway clearance, coughing and/or vibration techniques. Create an Sports administrator.   Oxygen: - Individual and group verbal and written instruction on oxygen therapy. Includes supplement oxygen, available portable oxygen systems, continuous and intermittent flow rates, oxygen safety, concentrators, and Medicare reimbursement for oxygen.   Respiratory Medications: - Group verbal and written instruction to review medications for lung disease. Drug class, frequency, complications, importance of spacers, rinsing mouth after steroid MDI's, and proper cleaning methods for nebulizers.      Pulmonary Rehab from 09/09/2015 in [facility] Cardiac and Pulmonary Rehab   Date  08/20/15   Educator  LB   Instruction Review Code  2- meets goals/outcomes      AED/CPR: - Group verbal and written instruction with the use of models to demonstrate the basic use of the AED with the basic ABC's of resuscitation.   Breathing Retraining: - Provides individuals verbal and written instruction on purpose, frequency, and proper technique of diaphragmatic breathing and pursed-lipped breathing. Applies individual practice skills.      Pulmonary Rehab from 09/09/2015 in [facility] Cardiac and Pulmonary Rehab   Date   08/20/15   Educator  LB   Instruction Review Code  2- meets goals/outcomes      Anatomy and Physiology of the Lungs: - Group verbal and written instruction with the use of models to provide basic  lung anatomy and physiology related to function, structure and complications of lung disease.   Heart Failure: - Group verbal and written instruction on the basics of heart failure: signs/symptoms, treatments, explanation of ejection fraction, enlarged heart and cardiomyopathy.   Sleep Apnea: - Individual verbal and written instruction to review Obstructive Sleep Apnea. Review of risk factors, methods for diagnosing and types of masks and machines for OSA.   Anxiety: - Provides group, verbal and written instruction on the correlation between heart/lung disease and anxiety, treatment options, and management of anxiety.   Relaxation: - Provides group, verbal and written instruction about the benefits of relaxation for patients with heart/lung disease. Also provides patients with examples of relaxation techniques.   Knowledge Questionnaire Score:     Knowledge Questionnaire Score - 08/20/15 0830    Knowledge Questionnaire Score   Pre Score -3      Personal Goals and Risk Factors at Admission:     Personal Goals and Risk Factors at Admission - 08/20/15 0830    Personal Goals and Risk Factors on Admission    Weight Management Yes   Intervention Learn and follow the exercise and diet guidelines while in the program. Utilize the nutrition and education classes to help gain knowledge of the diet and exercise expectations in the program  Mr Guertin would like to loss weight, but he is not sure he wants to meet with the dietitian. He has cut down on his portion sizes and has recently lost 8lbs.   Admit Weight 264 lb (119.75 kg)   Goal Weight 200 lb (90.719 kg)   Increase Aerobic Exercise and Physical Activity Yes   Intervention While in program, learn and follow the exercise prescription  taught. Start at a low level workload and increase workload after able to maintain previous level for 30 minutes. Increase time before increasing intensity.  Mr Evett would like to increase his exercise capacity. He has a home treadmill, stationary bike, and weights.   Understand more about Heart/Pulmonary Disease. Yes   Intervention While in program utilize professionals for any questions, and attend the education sessions. Great websites to use are www.americanheart.org or www.lung.org for reliable information.  Ms Kita has Mild COPD, Asthma, and OSA and is very interested in learning new knowledge of these diseases and how to manage them.   Improve shortness of breath with ADL's Yes   Intervention While in program, learn and follow the exercise prescription taught. Start at a low level workload and increase workload ad advised by the exercise physiologist. Increase time before increasing intensity.  Mr Soter goes at thing "all out" and does experience shortness of breath. He will benefit from supervised exercise, pacing , and PLB.   Develop more efficient breathing techniques such as purse lipped breathing and diaphragmatic breathing; and practicing self-pacing with activity Yes   Intervention While in program, learn and utilize the specific breathing techniques taught to you. Continue to practice and use the techniques as needed.  Demonstrated good technique and understanding of PLBing   Increase knowledge of respiratory medications and ability to use respiratory devices properly.  Yes   Intervention While in program, learn to administer MDI, nebulizer, and spacer properly.;Learn to take respiratory medicine as ordered.;While in program, learn to Clean MDI, nebulizers, and spacers properly.  Mr Callens takes Spiriva, Symbicort, and Proventil MDI. Educated on these inhalers and gave Mr Blacksher a spacer. Mr Tremper is also on CPAP, full mask, provided by the [place].   Diabetes Yes  Goal Blood glucose  control identified by blood glucose values, HgbA1C. Participant verbalizes understanding of the signs/symptoms of hyper/hypo glycemia, proper foot care and importance of medication and nutrition plan for blood glucose control.   Intervention Provide nutrition & aerobic exercise along with prescribed medications to achieve blood glucose in normal ranges: Fasting 65-99 mg/dL   Hypertension Yes   Goal Participant will see blood pressure controlled within the values of 140/32m/Hg or within value directed by their physician.   Intervention Provide nutrition & aerobic exercise along with prescribed medications to achieve BP 140/90 or less.      Personal Goals and Risk Factors Review:    Personal Goals Discharge (Final Personal Goals and Risk Factors Review):    ITP Comments:     ITP Comments      09/09/15 1110 10/14/15 1516         ITP Comments Exercise and personal goals anticipated to be met in 20 more sessions. Called Mr Schranz and left him a message [place] and [place] could start [facility] this week.         Comments: 30 day note review

## 2015-10-16 ENCOUNTER — Encounter: Payer: No Typology Code available for payment source | Admitting: *Deleted

## 2015-10-16 DIAGNOSIS — J449 Chronic obstructive pulmonary disease, unspecified: Secondary | ICD-10-CM

## 2015-10-16 LAB — GLUCOSE, CAPILLARY
GLUCOSE-CAPILLARY: 172 mg/dL — AB (ref 65–99)
GLUCOSE-CAPILLARY: 93 mg/dL (ref 65–99)

## 2015-10-16 NOTE — Progress Notes (Signed)
Daily Session Note  Patient Details  Name: Adrian Harris. MRN: [id number] Date of Birth: Nov 10, 1950 Referring Provider:  Erby Pian, MD  Encounter Date: 10/16/2015  Check In:     Session Check In - 10/16/15 1050    Check-In   Location [facility]-Cardiac & Pulmonary Rehab   Staff Present Candiss Norse, MS, ACSM CEP, Exercise Physiologist;Steven Way, BS, ACSM EP-C, Exercise Physiologist;Laureen Owens Shark, BS, RRT, Respiratory Therapist   Supervising physician immediately available to respond to emergencies [facility] immediately available ER MD   Physician(s) Owens Shark and Archie Balboa   Medication changes reported     No   Fall or balance concerns reported    No   Warm-up and Cool-down Performed on first and last piece of equipment   Resistance Training Performed Yes   VAD Patient? No   Pain Assessment   Currently in Pain? No/denies           Exercise Prescription Changes - 10/16/15 1000    Response to Exercise   Symptoms None   Comments Second day of exercise! Patient was re-oriented to the gym and the equipment functions and settings. Procedures and policies of the gym were outlined and explained. The patient's individual exercise prescription and treatment plan were reviewed with them. All starting workloads were established based on the results of the functional testing  done at the initial intake visit. The plan for exercise progression was also reviewed and progression will be customized based on the patient's performance and goals.    Duration Progress to 30 minutes of continuous aerobic without signs/symptoms of physical distress   Intensity Rest + 30   Progression Continue progressive overload as per policy without signs/symptoms or physical distress.   Resistance Training   Training Prescription Yes   Weight 4   Reps 10-12   Treadmill   MPH 3   Grade 0   Minutes 15   REL-XR   Level 2   Watts 70   Minutes 10   T5 Nustep   Level 2   Watts 40   Minutes 15       Goals Met:  Proper associated with RPD/PD & O2 Sat Independence with exercise equipment Using PLB without cueing & demonstrates good technique Exercise tolerated well Personal goals reviewed Strength training completed today  Goals Unmet:  Not Applicable  Goals Comments: Patient completed exercise prescription and all exercise goals during rehab session. The exercise was tolerated well and the patient is progressing in the program.    Dr. Emily Filbert is Medical Director for [place] and [facility].

## 2015-10-21 ENCOUNTER — Encounter: Payer: No Typology Code available for payment source | Admitting: *Deleted

## 2015-10-21 DIAGNOSIS — G4730 Sleep apnea, unspecified: Secondary | ICD-10-CM

## 2015-10-21 DIAGNOSIS — J449 Chronic obstructive pulmonary disease, unspecified: Secondary | ICD-10-CM

## 2015-10-21 DIAGNOSIS — J4520 Mild intermittent asthma, uncomplicated: Secondary | ICD-10-CM

## 2015-10-21 LAB — GLUCOSE, CAPILLARY: Glucose-Capillary: 112 mg/dL — ABNORMAL HIGH (ref 65–99)

## 2015-10-21 NOTE — Progress Notes (Signed)
Daily Session Note  Patient Details  Name: Adrian Harris. MRN: [id number] Date of Birth: September 19, 1950 Referring Provider:  Erby Pian, MD  Encounter Date: 10/21/2015  Check In:     Session Check In - 10/21/15 1047    Check-In   Location [facility]   Staff Present Lestine Box, BS, ACSM EP-C, Exercise Physiologist;Jaydeen Odor Amedeo Plenty, BS, ACSM CEP, Exercise Physiologist;Laureen Owens Shark, BS, RRT, Respiratory Therapist   Supervising physician immediately available to respond to emergencies [facility] immediately available ER MD   Physician(s) Dr. Joni Fears and Dr. Clearnce Hasten   Medication changes reported     No   Fall or balance concerns reported    No   Warm-up and Cool-down Performed on first and last piece of equipment   Resistance Training Performed Yes   VAD Patient? No   Pain Assessment   Currently in Pain? No/denies         Goals Met:  Proper associated with RPD/PD & O2 Sat Independence with exercise equipment Exercise tolerated well Strength training completed today  Goals Unmet:  Not Applicable  Goals Comments: Patient completed exercise prescription and all exercise goals during rehab session. The exercise was tolerated well and the patient is progressing in the program.     Dr. Emily Filbert is Medical Director for [place] and [facility].

## 2015-10-23 ENCOUNTER — Encounter: Payer: No Typology Code available for payment source | Attending: Specialist

## 2015-10-23 DIAGNOSIS — J449 Chronic obstructive pulmonary disease, unspecified: Secondary | ICD-10-CM | POA: Insufficient documentation

## 2015-10-23 DIAGNOSIS — J4520 Mild intermittent asthma, uncomplicated: Secondary | ICD-10-CM

## 2015-10-23 LAB — GLUCOSE, CAPILLARY: Glucose-Capillary: 227 mg/dL — ABNORMAL HIGH (ref 65–99)

## 2015-10-23 NOTE — Progress Notes (Signed)
Daily Session Note  Patient Details  Name: Adrian Harris. MRN: [id number] Date of Birth: 14-Oct-1950 Referring Provider:  Erby Pian, MD  Encounter Date: 10/23/2015  Check In:     Session Check In - 10/23/15 1143    Check-In   Location [facility]   Staff Present Heath Lark, RN, BSN, CCRP;Ramsay Bognar, BS, ACSM EP-C, Exercise Physiologist;Laureen Owens Shark, BS, RRT, Respiratory Therapist   Supervising physician immediately available to respond to emergencies [facility] immediately available ER MD   Physician(s) quale and mcshane   Medication changes reported     No   Fall or balance concerns reported    No   Warm-up and Cool-down Performed on first and last piece of equipment   Resistance Training Performed No   VAD Patient? No   Pain Assessment   Currently in Pain? No/denies         Goals Met:  Proper associated with RPD/PD & O2 Sat Exercise tolerated well No report of cardiac concerns or symptoms Strength training completed today  Goals Unmet:  Not Applicable  Comments: [name] was doing well with exercise, encouraged by gains.   Dr. Emily Filbert is Medical Director for [place] and [facility].

## 2015-10-25 ENCOUNTER — Encounter: Payer: No Typology Code available for payment source | Admitting: *Deleted

## 2015-10-25 DIAGNOSIS — J449 Chronic obstructive pulmonary disease, unspecified: Secondary | ICD-10-CM | POA: Diagnosis not present

## 2015-10-25 DIAGNOSIS — J4520 Mild intermittent asthma, uncomplicated: Secondary | ICD-10-CM

## 2015-10-25 NOTE — Progress Notes (Signed)
Daily Session Note  Patient Details  Name: Adrian Harris. MRN: [id number] Date of Birth: 14-Feb-1951 Referring Provider:  Erby Pian, MD  Encounter Date: 10/25/2015  Check In:     Session Check In - 10/25/15 1101    Check-In   Location [facility]   Staff Present Heath Lark, RN, BSN, CCRP;Wendall Isabell, RN, BSN;Stacey Blanch Media, RRT, RCP, Respiratory Therapist;Rebecca Sickles, PT   Supervising physician immediately available to respond to emergencies [facility] immediately available ER MD   Physician(s) Dr. Archie Balboa and Dr. Reita Cliche   Medication changes reported     No   Warm-up and Cool-down Performed on first and last piece of equipment   Resistance Training Performed Yes   VAD Patient? No   Pain Assessment   Currently in Pain? No/denies         Goals Met:  Proper associated with RPD/PD & O2 Sat Exercise tolerated well  Goals Unmet:  Not Applicable  Comments:    Dr. Emily Filbert is Medical Director for [place] and [facility].

## 2015-10-28 ENCOUNTER — Encounter: Payer: No Typology Code available for payment source | Admitting: *Deleted

## 2015-10-28 DIAGNOSIS — J4520 Mild intermittent asthma, uncomplicated: Secondary | ICD-10-CM

## 2015-10-28 DIAGNOSIS — J449 Chronic obstructive pulmonary disease, unspecified: Secondary | ICD-10-CM | POA: Diagnosis not present

## 2015-10-28 DIAGNOSIS — G4730 Sleep apnea, unspecified: Secondary | ICD-10-CM

## 2015-10-28 NOTE — Progress Notes (Signed)
Daily Session Note  Patient Details  Name: Adrian Harris. MRN: [id number] Date of Birth: Nov 03, 1950 Referring Provider:  Erby Pian, MD  Encounter Date: 10/28/2015  Check In:     Session Check In - 10/28/15 1033    Check-In   Location [facility]   Staff Present Carson Myrtle, BS, RRT, Respiratory Therapist;Steven Way, BS, ACSM EP-C, Exercise Physiologist;Windle Huebert Amedeo Plenty, BS, ACSM CEP, Exercise Physiologist   Supervising physician immediately available to respond to emergencies [facility] immediately available ER MD   Physician(s) Dr. Clearnce Hasten and Dr. Kerman Passey   Medication changes reported     No   Fall or balance concerns reported    No   Warm-up and Cool-down Performed on first and last piece of equipment   Resistance Training Performed Yes   VAD Patient? No   Pain Assessment   Currently in Pain? No/denies         Goals Met:  Proper associated with RPD/PD & O2 Sat Independence with exercise equipment Exercise tolerated well Strength training completed today  Goals Unmet:  Not Applicable  Comments: Patient completed exercise prescription and all exercise goals during rehab session. The exercise was tolerated well and the patient is progressing in the program.     Dr. Emily Filbert is Medical Director for [place] and [facility].

## 2015-10-30 ENCOUNTER — Encounter: Payer: No Typology Code available for payment source | Admitting: *Deleted

## 2015-10-30 DIAGNOSIS — J449 Chronic obstructive pulmonary disease, unspecified: Secondary | ICD-10-CM | POA: Diagnosis not present

## 2015-10-30 NOTE — Progress Notes (Signed)
Daily Session Note  Patient Details  Name: Adrian Harris. MRN: [id number] Date of Birth: [birth year] Referring Provider:  Erby Pian, MD  Encounter Date: 10/30/2015  Check In:     Session Check In - 10/30/15 1036    Check-In   Location [facility]   Staff Present Candiss Norse, MS, ACSM CEP, Exercise Physiologist;Steven Way, BS, ACSM EP-C, Exercise Physiologist;Laureen Owens Shark, BS, RRT, Respiratory Therapist   Supervising physician immediately available to respond to emergencies [facility] immediately available ER MD   Physician(s) Edd Fabian and Cinda Quest   Medication changes reported     No   Fall or balance concerns reported    No   Warm-up and Cool-down Performed on first and last piece of equipment   Resistance Training Performed Yes   VAD Patient? No   Pain Assessment   Currently in Pain? No/denies   Multiple Pain Sites No           Exercise Prescription Changes - 10/30/15 1000    Exercise Review   Progression Yes   Response to Exercise   Symptoms none   Comments Reviewed individualized exercise prescription and made increases per departmental policy. Exercise increases were discussed with the patient and they were able to perform the new work loads without issue (no signs or symptoms).    Duration Progress to 50 minutes of aerobic without signs/symptoms of physical distress   Intensity THRR unchanged   Progression   Progression Continue progressive overload as per policy without signs/symptoms or physical distress.   Resistance Training   Training Prescription Yes   Weight 4   Reps 10-12   Treadmill   MPH 3.5   Grade 1   Minutes 15   Elliptical   Level 2   Speed 3.8   Minutes 12   T5 Nustep   Level 5   Watts 55   Minutes 15      Goals Met:  Proper associated with RPD/PD & O2 Sat Independence with exercise equipment Using PLB without cueing & demonstrates good technique Exercise tolerated well Personal goals reviewed Strength  training completed today  Goals Unmet:  Not Applicable  Comments: Patient completed exercise prescription and all exercise goals during rehab session. The exercise was tolerated well and the patient is progressing in the program.    Dr. Emily Filbert is Medical Director for [place] and [facility].

## 2015-11-01 ENCOUNTER — Encounter: Payer: No Typology Code available for payment source | Admitting: *Deleted

## 2015-11-01 DIAGNOSIS — J449 Chronic obstructive pulmonary disease, unspecified: Secondary | ICD-10-CM | POA: Diagnosis not present

## 2015-11-01 NOTE — Progress Notes (Signed)
Daily Session Note  Patient Details  Name: Adrian Harris. MRN: [id number] Date of Birth: 11-Apr-1951 Referring Provider:  Erby Pian, MD  Encounter Date: 11/01/2015  Check In:     Session Check In - 11/01/15 1020    Check-In   Location [facility]   Staff Present Candiss Norse, MS, ACSM CEP, Exercise Physiologist;Carroll Enterkin, RN, BSN;Stacey Blanch Media, RRT, RCP, Respiratory Therapist;Kendall Caprice Beaver, BS, Exercise Physiologist   Supervising physician immediately available to respond to emergencies [facility] immediately available ER MD   Physician(s) Jimmye Norman and Jacqualine Code   Medication changes reported     No   Fall or balance concerns reported    No   Warm-up and Cool-down Performed on first and last piece of equipment   Resistance Training Performed Yes   VAD Patient? No   Pain Assessment   Currently in Pain? No/denies   Multiple Pain Sites No           Exercise Prescription Changes - 11/01/15 1000    Exercise Review   Progression Yes   Response to Exercise   Symptoms none   Comments Increased elliptical time to accomodate increase in stamina and conditioning   Duration Progress to 50 minutes of aerobic without signs/symptoms of physical distress   Intensity THRR unchanged   Progression   Progression Continue progressive overload as per policy without signs/symptoms or physical distress.   Resistance Training   Training Prescription Yes   Weight 4   Reps 10-12   Treadmill   MPH 3.5   Grade 1   Minutes 15   Recumbant Elliptical   Level 5   RPM 35   Watts 15   Elliptical   Level 2   Speed 3.8   Minutes 15   T5 Nustep   Level 5   Watts 55   Minutes 15      Goals Met:  Proper associated with RPD/PD & O2 Sat Independence with exercise equipment Using PLB without cueing & demonstrates good technique Exercise tolerated well Personal goals reviewed Strength training completed today  Goals Unmet:  Not Applicable  Comments:  Patient completed exercise prescription and all exercise goals during rehab session. The exercise was tolerated well and the patient is progressing in the program.    Dr. Emily Filbert is Medical Director for [place] and [facility].

## 2015-11-04 ENCOUNTER — Encounter: Payer: No Typology Code available for payment source | Admitting: *Deleted

## 2015-11-04 DIAGNOSIS — G4730 Sleep apnea, unspecified: Secondary | ICD-10-CM

## 2015-11-04 DIAGNOSIS — J449 Chronic obstructive pulmonary disease, unspecified: Secondary | ICD-10-CM

## 2015-11-04 DIAGNOSIS — J4520 Mild intermittent asthma, uncomplicated: Secondary | ICD-10-CM

## 2015-11-04 NOTE — Progress Notes (Signed)
Daily Session Note  Patient Details  Name: Adrian Harris. MRN: [id number] Date of Birth: [birth year] Referring Provider:  Erby Pian, MD  Encounter Date: 11/04/2015  Check In:     Session Check In - 11/04/15 1025    Check-In   Location [facility]   Staff Present Earlean Shawl, BS, ACSM CEP, Exercise Physiologist;Steven Way, BS, ACSM EP-C, Exercise Physiologist;Laureen Owens Shark, BS, RRT, Respiratory Therapist   Supervising physician immediately available to respond to emergencies [facility] immediately available ER MD   Physician(s) Corky Downs and Paduchowski   Medication changes reported     No   Fall or balance concerns reported    No   Warm-up and Cool-down Performed on first and last piece of equipment   Resistance Training Performed Yes   VAD Patient? No   Pain Assessment   Currently in Pain? No/denies           Exercise Prescription Changes - 11/04/15 1000    Exercise Review   Progression Yes   Response to Exercise   Symptoms none   Comments Increases in exercise prescription were reviewed with the patient. The increases were tolerated well with no signs or symptoms.    Duration Progress to 50 minutes of aerobic without signs/symptoms of physical distress   Intensity THRR unchanged   Progression   Progression Continue progressive overload as per policy without signs/symptoms or physical distress.   Resistance Training   Training Prescription Yes   Weight 4   Reps 10-12   Treadmill   MPH 3.5   Grade 1   Minutes 15   Recumbant Elliptical   Level 5   RPM 35   Watts 15   Elliptical   Level 2   Speed 3.8   Minutes 15   T5 Nustep   Level 6   Watts 75   Minutes 15      Goals Met:  Proper associated with RPD/PD & O2 Sat Independence with exercise equipment Exercise tolerated well Personal goals reviewed Strength training completed today  Goals Unmet:  Not Applicable  Comments: Reviewed individualized exercise prescription and  made increases per departmental policy. Exercise increases were discussed with the patient and they were able to perform the new work loads without issue (no signs or symptoms).     Dr. Emily Filbert is Medical Director for [place] and [facility].

## 2015-11-06 ENCOUNTER — Encounter: Payer: No Typology Code available for payment source | Admitting: *Deleted

## 2015-11-06 DIAGNOSIS — J449 Chronic obstructive pulmonary disease, unspecified: Secondary | ICD-10-CM

## 2015-11-06 NOTE — Progress Notes (Signed)
Daily Session Note  Patient Details  Name: Adrian Harris. MRN: [id number] Date of Birth: 08/02/1951 Referring Provider:  Erby Pian, MD  Encounter Date: 11/06/2015  Check In:     Session Check In - 11/06/15 1033    Check-In   Location [facility]   Staff Present Candiss Norse, MS, ACSM CEP, Exercise Physiologist;Steven Way, BS, ACSM EP-C, Exercise Physiologist;Laureen Owens Shark, BS, RRT, Respiratory Therapist   Supervising physician immediately available to respond to emergencies [facility] immediately available ER MD   Physician(s) Archie Balboa and Marcelene Butte   Medication changes reported     No   Fall or balance concerns reported    No   Warm-up and Cool-down Performed on first and last piece of equipment   Resistance Training Performed Yes   VAD Patient? No   Pain Assessment   Currently in Pain? No/denies   Multiple Pain Sites No         Goals Met:  Proper associated with RPD/PD & O2 Sat Independence with exercise equipment Using PLB without cueing & demonstrates good technique Exercise tolerated well Strength training completed today  Goals Unmet:  Not Applicable  Comments: Patient completed exercise prescription and all exercise goals during rehab session. The exercise was tolerated well and the patient is progressing in the program.   Dr. Emily Filbert is Medical Director for [place] and [facility].

## 2015-11-08 ENCOUNTER — Encounter: Payer: No Typology Code available for payment source | Admitting: *Deleted

## 2015-11-08 DIAGNOSIS — J449 Chronic obstructive pulmonary disease, unspecified: Secondary | ICD-10-CM | POA: Diagnosis not present

## 2015-11-08 NOTE — Progress Notes (Signed)
Daily Session Note  Patient Details  Name: Adrian Harris. MRN: [id number] Date of Birth: 1950-09-22 Referring Provider:  Erby Pian, MD  Encounter Date: 11/08/2015  Check In:     Session Check In - 11/08/15 1046    Check-In   Location [facility]   Staff Present Gerlene Burdock, RN, Drusilla Kanner, MS, ACSM CEP, Exercise Physiologist;Susanne Bice, RN, BSN, CCRP;Stacey Blanch Media, RRT, RCP, Respiratory Therapist   Supervising physician immediately available to respond to emergencies [facility] immediately available ER MD   Physician(s) Jimmye Norman and Corky Downs   Medication changes reported     No   Fall or balance concerns reported    No   Warm-up and Cool-down Performed on first and last piece of equipment   Resistance Training Performed Yes   VAD Patient? No   Pain Assessment   Currently in Pain? No/denies   Multiple Pain Sites No           Exercise Prescription Changes - 11/08/15 1000    Exercise Review   Progression Yes   Response to Exercise   Symptoms none   Comments Increased ellliptical time to 16 minutes    Duration Progress to 50 minutes of aerobic without signs/symptoms of physical distress   Intensity THRR unchanged   Progression   Progression Continue progressive overload as per policy without signs/symptoms or physical distress.   Resistance Training   Training Prescription Yes   Weight 4   Reps 10-12   Treadmill   MPH 3.5   Grade 1   Minutes 15   Recumbant Elliptical   Level 5   RPM 35   Watts 15   Elliptical   Level 2   Speed 3.8   Minutes 16   T5 Nustep   Level 6   Watts 75   Minutes 15      Goals Met:  Proper associated with RPD/PD & O2 Sat Independence with exercise equipment Using PLB without cueing & demonstrates good technique Exercise tolerated well Personal goals reviewed Strength training completed today  Goals Unmet:  Not Applicable  Comments:Patient completed exercise prescription and all  exercise goals during rehab session. The exercise was tolerated well and the patient is progressing in the program.   Dr. Emily Filbert is Medical Director for [place] and [facility].

## 2015-11-11 ENCOUNTER — Encounter: Payer: No Typology Code available for payment source | Admitting: *Deleted

## 2015-11-11 DIAGNOSIS — G4730 Sleep apnea, unspecified: Secondary | ICD-10-CM

## 2015-11-11 DIAGNOSIS — J449 Chronic obstructive pulmonary disease, unspecified: Secondary | ICD-10-CM

## 2015-11-11 DIAGNOSIS — J4520 Mild intermittent asthma, uncomplicated: Secondary | ICD-10-CM

## 2015-11-11 NOTE — Progress Notes (Signed)
Pulmonary Individual Treatment Plan  Patient Details  Name: Adrian Harris. MRN: [id number] Date of Birth: March 04, 1951 [facility] Provider:   [facility]  Initial Encounter Date: 08/20/15      Pulmonary Rehab from 08/20/2015 in [place]   Date  08/20/15      Visit Diagnosis: COPD, mild ([place])  Asthma, mild intermittent, uncomplicated  Sleep apnea  Patient's Home Medications on Admission:  Current outpatient prescriptions:  .  albuterol (PROAIR HFA) 108 (90 BASE) MCG/ACT inhaler, Inhale into the lungs., Disp: , Rfl:  .  budesonide-formoterol (SYMBICORT) 160-4.5 MCG/ACT inhaler, Inhale into the lungs., Disp: , Rfl:  .  Cholecalciferol (VITAMIN D-1000 MAX ST) 1000 UNITS tablet, Take by mouth., Disp: , Rfl:  .  glucose blood (ACCU-CHEK COMFORT CURVE) test strip, , Disp: , Rfl:  .  lisinopril (PRINIVIL,ZESTRIL) 20 MG tablet, Take by mouth., Disp: , Rfl:  .  metFORMIN (GLUCOPHAGE-XR) 500 MG 24 hr tablet, Take by mouth., Disp: , Rfl:  .  mometasone (NASONEX) 50 MCG/ACT nasal spray, , Disp: , Rfl:  .  tiotropium (SPIRIVA) 18 MCG inhalation capsule, Place into inhaler and inhale., Disp: , Rfl:  .  vitamin B-12 (CYANOCOBALAMIN) 1000 MCG tablet, Take by mouth., Disp: , Rfl:   Past Medical History: No past medical history on file.  Tobacco Use: History  Smoking status  . Former Smoker -- 2.00 packs/day for 9 years  . Types: Cigarettes  Smokeless tobacco  . Former Systems developer  . Quit date: 11/11/1974    Labs: Recent Review Flowsheet Data    There is no flowsheet data to display.         POCT Glucose      10/16/15 1000 10/16/15 1145         POCT Blood Glucose   Pre-Exercise  129 mg/dL      Post-Exercise  121 mg/dL      Pre-Exercise #2 172 mg/dL       Post-Exercise #2 93 mg/dL          ADL UCSD:     ADL UCSD      08/20/15 0830 09/09/15 1000 10/23/15 1000   ADL UCSD   ADL Phase Entry Exit Exit   SOB Score total 34 53 44   Rest 0 0 0   Walk '1 1 2   ' Stairs '2 4 4   ' Bath 0 2 0   Dress 0 2 1   Shop '2 2 2     ' 10/29/15 0827       ADL UCSD   ADL Phase Mid        Pulmonary Function Assessment:     Pulmonary Function Assessment - 08/20/15 0830    Pulmonary Function Tests   RV% 74 %   DLCO% 94 %   Initial Spirometry Results   FVC% 94 %   FEV1% 81 %   FEV1/FVC Ratio 68   Breath   Bilateral Breath Sounds Clear   Shortness of Breath Yes;Limiting activity      Exercise Target Goals:    Exercise Program Goal: Individual exercise prescription set with THRR, safety & activity barriers. Participant demonstrates ability to understand and report RPE using BORG scale, to self-measure pulse accurately, and to acknowledge the importance of the exercise prescription.  Exercise Prescription Goal: Starting with aerobic activity 30 plus minutes a day, 3 days per week for initial exercise prescription. Provide home exercise prescription and guidelines that participant acknowledges understanding prior to discharge.  Activity Barriers & Risk Stratification:     Activity Barriers & Cardiac Risk Stratification - 08/20/15 0830    Activity Barriers & Cardiac Risk Stratification   Activity Barriers Shortness of Breath;Arthritis;Deconditioning;Muscular Weakness   Cardiac Risk Stratification Moderate      6 Minute Walk:     6 Minute Walk      08/20/15 1004 09/16/15 1022 10/23/15 0822   6 Minute Walk   Phase Initial Mid Program --   Distance 1700 feet 1000 feet --   Walk Time 6 minutes 6 minutes --   RPE 12 13 --   Perceived Dyspnea  3 3 --   Symptoms No No No   Resting HR 74 bpm 74 bpm --   Resting BP 118/74 mmHg 124/64 mmHg --   Max Ex. HR 117 bpm 82 bpm --   Max Ex. BP 144/76 mmHg 142/74 mmHg --     10/29/15 0825       6 Minute Walk   Phase Mid Program     Distance --  Entered on wrong patient        Initial Exercise Prescription:     Initial Exercise Prescription - 08/20/15 1000    Date of Initial  Exercise Prescription   Date 08/20/15   Treadmill   MPH 2.5   Grade 0   Minutes 10   Recumbant Bike   Level 2   RPM 40   Watts 20   Minutes 10   NuStep   Level 2   Watts 50   Minutes 10   Arm Ergometer   Level 1   Watts 10   Minutes 10   Recumbant Elliptical   Level 2   RPM 40   Watts 20   Minutes 10   Elliptical   Level 1   Speed 3   Minutes 1   REL-XR   Level 3   Watts 50   Minutes 10   T5 Nustep   Level 1   Watts 20   Minutes 10   Biostep-RELP   Level 3   Watts 50   Minutes 10   Prescription Details   Duration Progress to 30 minutes of continuous aerobic without signs/symptoms of physical distress   Intensity   THRR REST +  30   Ratings of Perceived Exertion 11-15   Perceived Dyspnea 2-4   Progression   Progression Continue progressive overload as per policy without signs/symptoms or physical distress.   Resistance Training   Training Prescription Yes   Weight 2   Reps 10-15      Perform Capillary Blood Glucose checks as needed.  Exercise Prescription Changes:     Exercise Prescription Changes      09/09/15 1500 10/16/15 1000 10/25/15 1100 10/28/15 0622 10/30/15 1000   Exercise Review   Progression   Yes  Yes   Response to Exercise   Blood Pressure (Admit) 112/80 mmHg   126/82 mmHg    Blood Pressure (Exercise) 140/82 mmHg   142/82 mmHg    Blood Pressure (Exit) 112/68 mmHg   118/72 mmHg    Heart Rate (Admit) 95 bpm   80 bpm    Heart Rate (Exercise) 136 bpm   110 bpm    Heart Rate (Exit) 116 bpm   104 bpm    Oxygen Saturation (Admit) 97 %   99 %    Oxygen Saturation (Exercise) 99 %   98 %    Oxygen Saturation (Exit) 97 %  97 %    Rating of Perceived Exertion (Exercise) 13   12    Perceived Dyspnea (Exercise) 4   3    Symptoms  None None none none   Comments  Second day of exercise! Patient was re-oriented to the gym and the equipment functions and settings. Procedures and policies of the gym were outlined and explained. The patient's  individual exercise prescription and treatment plan were reviewed with them. All starting workloads were established based on the results of the functional testing  done at the initial intake visit. The plan for exercise progression was also reviewed and progression will be customized based on the patient's performance and goals.  Second day of exercise! Patient was re-oriented to the gym and the equipment functions and settings. Procedures and policies of the gym were outlined and explained. The patient's individual exercise prescription and treatment plan were reviewed with them. All starting workloads were established based on the results of the functional testing  done at the initial intake visit. The plan for exercise progression was also reviewed and progression will be customized based on the patient's performance and goals.   Reviewed individualized exercise prescription and made increases per departmental policy. Exercise increases were discussed with the patient and they were able to perform the new work loads without issue (no signs or symptoms).    Duration  Progress to 30 minutes of continuous aerobic without signs/symptoms of physical distress Progress to 30 minutes of continuous aerobic without signs/symptoms of physical distress Progress to 50 minutes of aerobic without signs/symptoms of physical distress Progress to 50 minutes of aerobic without signs/symptoms of physical distress   Intensity  Rest + 30 Rest + 30 THRR unchanged THRR unchanged   Progression   Progression  Continue progressive overload as per policy without signs/symptoms or physical distress. Continue progressive overload as per policy without signs/symptoms or physical distress. Continue progressive overload as per policy without signs/symptoms or physical distress. Continue progressive overload as per policy without signs/symptoms or physical distress.   Resistance Training   Training Prescription     Yes   Weight   4  4    Reps     10-12   Training Prescription (read-only) Yes Yes      Weight (read-only) 4 4      Reps (read-only) 10-12 10-12      Treadmill   MPH   3.5 3.5 3.5   Grade    1 1   Minutes    15 15   MPH (read-only) 3 3      Grade (read-only) 0 0      Minutes (read-only) 15 15      Recumbant Elliptical   Level (read-only)  5      RPM (read-only)  35      Minutes (read-only)  15      Elliptical   Level    2 2   Speed    3.8 3.8   Minutes   '3 7 12   ' REL-XR   Level (read-only) 2 2      Watts (read-only) 70 70      Minutes (read-only) 10 10      T5 Nustep   Level    2 5   Watts    70 55   Minutes    15 15   Level (read-only) 2 2      Watts (read-only) 40 40      Minutes (read-only) 15 15  11/01/15 1000 11/04/15 1000 11/08/15 1000 11/11/15 1000     Exercise Review   Progression Yes Yes Yes Yes    Response to Exercise   Symptoms none none none none    Comments Increased elliptical time to accomodate increase in stamina and conditioning Increases in exercise prescription were reviewed with the patient. The increases were tolerated well with no signs or symptoms.  Increased ellliptical time to 16 minutes      Duration Progress to 50 minutes of aerobic without signs/symptoms of physical distress Progress to 50 minutes of aerobic without signs/symptoms of physical distress Progress to 50 minutes of aerobic without signs/symptoms of physical distress Progress to 50 minutes of aerobic without signs/symptoms of physical distress    Intensity THRR unchanged THRR unchanged THRR unchanged THRR unchanged    Progression   Progression Continue progressive overload as per policy without signs/symptoms or physical distress. Continue progressive overload as per policy without signs/symptoms or physical distress. Continue progressive overload as per policy without signs/symptoms or physical distress. Continue progressive overload as per policy without signs/symptoms or physical distress.     Resistance Training   Training Prescription Yes Yes Yes Yes    Weight '4 4 4 4    ' Reps 10-12 10-12 10-12 10-12    Treadmill   MPH 3.5 3.5 3.5 3.5    Grade '1 1 1 1    ' Minutes '15 15 15 15    ' Recumbant Elliptical   Level '5 5 5 5    ' RPM 35 35 35 35    Watts '15 15 15 15    ' Elliptical   Level '2 2 2 5    ' Speed 3.8 3.8 3.8 4    Minutes '15 15 16 17    ' REL-XR   Level    6    Watts    90    Minutes    10    T5 Nustep   Level '5 6 6 6    ' Watts 55 75 75 75    Minutes '15 15 15 15       ' Exercise Comments:     Exercise Comments      11/11/15 1043           Exercise Comments Reviewed individualized exercise prescription and made increases per departmental policy. Exercise increases were discussed with the patient and they were able to perform the new work loads without issue (no signs or symptoms).           Discharge Exercise Prescription (Final Exercise Prescription Changes):     Exercise Prescription Changes - 11/11/15 1000    Exercise Review   Progression Yes   Response to Exercise   Symptoms none   Duration Progress to 50 minutes of aerobic without signs/symptoms of physical distress   Intensity THRR unchanged   Progression   Progression Continue progressive overload as per policy without signs/symptoms or physical distress.   Resistance Training   Training Prescription Yes   Weight 4   Reps 10-12   Treadmill   MPH 3.5   Grade 1   Minutes 15   Recumbant Elliptical   Level 5   RPM 35   Watts 15   Elliptical   Level 5   Speed 4   Minutes 17   REL-XR   Level 6   Watts 90   Minutes 10   T5 Nustep   Level 6   Watts 75   Minutes 15  Nutrition:  Target Goals: Understanding of nutrition guidelines, daily intake of sodium <1565m, cholesterol <2049m calories 30% from fat and 7% or less from saturated fats, daily to have 5 or more servings of fruits and vegetables.  Biometrics:      Post Biometrics - 08/20/15 1006     Post  Biometrics   Height 6'  1" (1.854 m)   Weight 264 lb (119.75 kg)   Waist Circumference 46 inches   Hip Circumference 48 inches   Waist to Hip Ratio 0.96 %   BMI (Calculated) 34.9      Nutrition Therapy Plan and Nutrition Goals:     Nutrition Therapy & Goals - 08/20/15 0830    Nutrition Therapy   Diet Mr Mccarron would like to loss weight, but he is not sure he wants to meet with the dietitian. He has cut down on his portion sizes and has recently lost 8lbs.      Nutrition Discharge: Rate Your Plate Scores:   Psychosocial: Target Goals: Acknowledge presence or absence of depression, maximize coping skills, provide positive support system. Participant is able to verbalize types and ability to use techniques and skills needed for reducing stress and depression.  Initial Review & Psychosocial Screening:     Initial Psych Review & Screening - 08/20/15 0830    Family Dynamics   Good Support System? Yes   Comments Mr AlPomplunas good support from his wife and children.   Barriers   Psychosocial barriers to participate in program There are no identifiable barriers or psychosocial needs.;The patient should benefit from training in stress management and relaxation.   Screening Interventions   Interventions Encouraged to exercise      Quality of Life Scores:     Quality of Life - 10/29/15 0818    Quality of Life Scores   Health/Function Pre 18.38 %   Socioeconomic Pre 28.57 %   Psych/Spiritual Pre 24.86 %   Family Pre 27.6 %   GLOBAL Pre 23.03 %      PHQ-9:     Recent Review Flowsheet Data    [facility] 08/20/2015   Decreased Interest 0 0   Down, Depressed, Hopeless 0 0   PHQ - 2 Score 0 0   Altered sleeping 0 -   Tired, decreased energy 2 -   Change in appetite 1 -   Feeling bad or failure about yourself  1 -   Trouble concentrating 1 -   Moving slowly or fidgety/restless 0 -   Suicidal thoughts 0 -   PHQ-9 Score 5 -   Difficult doing work/chores Somewhat  difficult -      Psychosocial Evaluation and Intervention:     Psychosocial Evaluation - 09/09/15 1101    Psychosocial Evaluation & Interventions   Interventions Stress management education;Relaxation education;Encouraged to exercise with the program and follow exercise prescription   Comments Counselor met with Mr. AlTillisoday for initial psychosocial evaluation.  He is a [age]ear old gentleman who has COPD and was ordered to come into this program.  He has a strong support system with a spouse of 21 years and several adult children close by.  Mr. AlLeedss also actively involved in his local faith community.  He reports sleeping well and having a good appetite.  He states hiss mood is generally positive and denies a history or current symptoms of anxiety or depression.  Mr. AlMcartoristed his stressors as having recently moved and his health issues  currently.  He has goals to breathe better and plans to follow up working out in his home with the gym equipment he has.  Counselor encouraged him to complete this program and to participate in the psychoeducational components as well.        Psychosocial Re-Evaluation:     Psychosocial Re-Evaluation      10/21/15 1202           Psychosocial Re-Evaluation   Comments Counselor follow up with Mr. Freel today.  He reports this program has been positive for him with benefits of increased stamina and strength.  He mentioned having some physiological symptoms of diarrhea the past two weeks off and on.  He stated if this did not improve he would see a Tax adviser.  Otherwise, his mood and his appetite, & sleep continue to be good.           Education: Education Goals: Education classes will be provided on a weekly basis, covering required topics. Participant will state understanding/return demonstration of topics presented.  Learning Barriers/Preferences:     Learning Barriers/Preferences - 08/20/15 0830    Learning Barriers/Preferences   Learning  Barriers None   Learning Preferences Group Instruction;Individual Instruction;Pictoral;Skilled Demonstration;Verbal Instruction;Video;Written Material      Education Topics: Initial Evaluation Education: - Verbal, written and demonstration of respiratory meds, RPE/PD scales, oximetry and breathing techniques. Instruction on use of nebulizers and MDIs: cleaning and proper use, rinsing mouth with steroid doses and importance of monitoring MDI activations.          Pulmonary Rehab from 11/04/2015 in [facility] Cardiac and Pulmonary Rehab   Date  08/20/15   Educator  LB   Instruction Review Code  2- meets goals/outcomes      General Nutrition Guidelines/Fats and Fiber: -Group instruction provided by verbal, written material, models and posters to present the general guidelines for heart healthy nutrition. Gives an explanation and review of dietary fats and fiber.   Controlling Sodium/Reading Food Labels: -Group verbal and written material supporting the discussion of sodium use in heart healthy nutrition. Review and explanation with models, verbal and written materials for utilization of the food label.   Exercise Physiology & Risk Factors: - Group verbal and written instruction with models to review the exercise physiology of the cardiovascular system and associated critical values. Details cardiovascular disease risk factors and the goals associated with each risk factor.   Aerobic Exercise & Resistance Training: - Gives group verbal and written discussion on the health impact of inactivity. On the components of aerobic and resistive training programs and the benefits of this training and how to safely progress through these programs.   Flexibility, Balance, General Exercise Guidelines: - Provides group verbal and written instruction on the benefits of flexibility and balance training programs. Provides general exercise guidelines with specific guidelines to those with heart or lung disease.  Demonstration and skill practice provided.   Stress Management: - Provides group verbal and written instruction about the health risks of elevated stress, cause of high stress, and healthy ways to reduce stress.      Pulmonary Rehab from 11/04/2015 in [facility] Cardiac and Pulmonary Rehab   Date  10/16/15   Educator  [facility]   Instruction Review Code  2- meets goals/outcomes      Depression: - Provides group verbal and written instruction on the correlation between heart/lung disease and depressed mood, treatment options, and the stigmas associated with seeking treatment.   Exercise & Equipment Safety: - Individual verbal instruction and  demonstration of equipment use and safety with use of the equipment.      Pulmonary Rehab from 11/04/2015 in [facility] Cardiac and Pulmonary Rehab   Date  09/09/15   Educator  LB   Instruction Review Code  2- meets goals/outcomes      Infection Prevention: - Provides verbal and written material to individual with discussion of infection control including proper hand washing and proper equipment cleaning during exercise session.      Pulmonary Rehab from 11/04/2015 in [facility] Cardiac and Pulmonary Rehab   Date  08/20/15   Educator  LB   Instruction Review Code  2- meets goals/outcomes      Falls Prevention: - Provides verbal and written material to individual with discussion of falls prevention and safety.      Pulmonary Rehab from 11/04/2015 in [facility] Cardiac and Pulmonary Rehab   Date  08/20/15   Educator  LB   Instruction Review Code  2- meets goals/outcomes      Diabetes: - Individual verbal and written instruction to review signs/symptoms of diabetes, desired ranges of glucose level fasting, after meals and with exercise. Advice that pre and post exercise glucose checks will be done for 3 sessions at entry of program.      Pulmonary Rehab from 11/04/2015 in [facility] Cardiac and Pulmonary Rehab   Date  09/09/15   Educator  LB   Instruction Review Code  2- meets  goals/outcomes      Chronic Lung Diseases: - Group verbal and written instruction to review new updates, new respiratory medications, new advancements in procedures and treatments. Provide informative websites and "800" numbers of self-education.   Lung Procedures: - Group verbal and written instruction to describe testing methods done to diagnose lung disease. Review the outcome of test results. Describe the treatment choices: Pulmonary Function Tests, ABGs and oximetry.   Energy Conservation: - Provide group verbal and written instruction for methods to conserve energy, plan and organize activities. Instruct on pacing techniques, use of adaptive equipment and posture/positioning to relieve shortness of breath.   Triggers: - Group verbal and written instruction to review types of environmental controls: home humidity, furnaces, filters, dust mite/pet prevention, HEPA vacuums. To discuss weather changes, air quality and the benefits of nasal washing.      Pulmonary Rehab from 11/04/2015 in [facility] Cardiac and Pulmonary Rehab   Date  09/09/15   Educator  LB   Instruction Review Code  2- meets goals/outcomes      Exacerbations: - Group verbal and written instruction to provide: warning signs, infection symptoms, calling MD promptly, preventive modes, and value of vaccinations. Review: effective airway clearance, coughing and/or vibration techniques. Create an Sports administrator.      Pulmonary Rehab from 11/04/2015 in [facility] Cardiac and Pulmonary Rehab   Date  11/04/15   Educator  LB   Instruction Review Code  2- meets goals/outcomes      Oxygen: - Individual and group verbal and written instruction on oxygen therapy. Includes supplement oxygen, available portable oxygen systems, continuous and intermittent flow rates, oxygen safety, concentrators, and Medicare reimbursement for oxygen.   Respiratory Medications: - Group verbal and written instruction to review medications for lung disease.  Drug class, frequency, complications, importance of spacers, rinsing mouth after steroid MDI's, and proper cleaning methods for nebulizers.      Pulmonary Rehab from 11/04/2015 in [facility] Cardiac and Pulmonary Rehab   Date  08/20/15   Educator  LB   Instruction Review Code  2- meets  goals/outcomes      AED/CPR: - Group verbal and written instruction with the use of models to demonstrate the basic use of the AED with the basic ABC's of resuscitation.      Pulmonary Rehab from 11/04/2015 in [facility] Cardiac and Pulmonary Rehab   Date  10/25/15   Educator  CE   Instruction Review Code  2- meets goals/outcomes      Breathing Retraining: - Provides individuals verbal and written instruction on purpose, frequency, and proper technique of diaphragmatic breathing and pursed-lipped breathing. Applies individual practice skills.      Pulmonary Rehab from 11/04/2015 in [facility] Cardiac and Pulmonary Rehab   Date  08/20/15   Educator  LB   Instruction Review Code  2- meets goals/outcomes      Anatomy and Physiology of the Lungs: - Group verbal and written instruction with the use of models to provide basic lung anatomy and physiology related to function, structure and complications of lung disease.   Heart Failure: - Group verbal and written instruction on the basics of heart failure: signs/symptoms, treatments, explanation of ejection fraction, enlarged heart and cardiomyopathy.   Sleep Apnea: - Individual verbal and written instruction to review Obstructive Sleep Apnea. Review of risk factors, methods for diagnosing and types of masks and machines for OSA.   Anxiety: - Provides group, verbal and written instruction on the correlation between heart/lung disease and anxiety, treatment options, and management of anxiety.   Relaxation: - Provides group, verbal and written instruction about the benefits of relaxation for patients with heart/lung disease. Also provides patients with examples of  relaxation techniques.      Pulmonary Rehab from 11/04/2015 in [facility] Cardiac and Pulmonary Rehab   Date  10/30/15   Educator  [facility]   Instruction Review Code  2- Meets goals/outcomes      Knowledge Questionnaire Score:     Knowledge Questionnaire Score - 08/20/15 0830    Knowledge Questionnaire Score   Pre Score -3       Personal Goals and Risk Factors at Admission:     Personal Goals and Risk Factors at Admission - 08/20/15 0830    Core Components/Risk Factors/Patient Goals on Admission    Weight Management Yes   Intervention (read-only) Learn and follow the exercise and diet guidelines while in the program. Utilize the nutrition and education classes to help gain knowledge of the diet and exercise expectations in the program  Mr Skarda would like to loss weight, but he is not sure he wants to meet with the dietitian. He has cut down on his portion sizes and has recently lost 8lbs.   Admit Weight 264 lb (119.75 kg)   Goal Weight: Short Term 200 lb (90.719 kg)   Sedentary Yes   Intervention (read-only) While in program, learn and follow the exercise prescription taught. Start at a low level workload and increase workload after able to maintain previous level for 30 minutes. Increase time before increasing intensity.  Mr Henningsen would like to increase his exercise capacity. He has a home treadmill, stationary bike, and weights.   Improve shortness of breath with ADL's Yes   Intervention (read-only) While in program, learn and follow the exercise prescription taught. Start at a low level workload and increase workload ad advised by the exercise physiologist. Increase time before increasing intensity.  Mr Tenaglia goes at thing "all out" and does experience shortness of breath. He will benefit from supervised exercise, pacing , and PLB.   Develop more efficient  breathing techniques such as purse lipped breathing and diaphragmatic breathing; and practicing self-pacing with activity Yes    Intervention (read-only) While in program, learn and utilize the specific breathing techniques taught to you. Continue to practice and use the techniques as needed.  Demonstrated good technique and understanding of PLBing   Increase knowledge of respiratory medications and ability to use respiratory devices properly  Yes   Intervention (read-only) While in program, learn to administer MDI, nebulizer, and spacer properly.;Learn to take respiratory medicine as ordered.;While in program, learn to Clean MDI, nebulizers, and spacers properly.  Mr Zimbelman takes Spiriva, Symbicort, and Proventil MDI. Educated on these inhalers and gave Mr Hartinger a spacer. Mr Hinnant is also on CPAP, full mask, provided by the [place].   Diabetes Yes   Goal Blood glucose control identified by blood glucose values, HgbA1C. Participant verbalizes understanding of the signs/symptoms of hyper/hypo glycemia, proper foot care and importance of medication and nutrition plan for blood glucose control.   Intervention (read-only) Provide nutrition & aerobic exercise along with prescribed medications to achieve blood glucose in normal ranges: Fasting 65-99 mg/dL   Hypertension Yes   Goal Participant will see blood pressure controlled within the values of 140/36m/Hg or within value directed by their physician.   Intervention (read-only) Provide nutrition & aerobic exercise along with prescribed medications to achieve BP 140/90 or less.   Understand more about Heart/Pulmonary Disease. Yes   Intervention While in program utilize professionals for any questions, and attend the education sessions. Great websites to use are www.americanheart.org or www.lung.org for reliable information.  Ms Hillebrand has Mild COPD, Asthma, and OSA and is very interested in learning new knowledge of these diseases and how to manage them.      Personal Goals and Risk Factors Review:      Goals and Risk Factor Review      10/29/15 0851 10/30/15 1526          Core Components/Risk Factors/Patient Goals Review   Personal Goals Review Sedentary       Review GClaxtonhas been working exceptionally hard during class. 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 He has been using the spacer with no difficulties with his MDIs.      Expected Outcomes We should continue to see progression of workloads on all equipment in the next two weeks, pending regular attendance.          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 He has been using the spacer with no difficulties with his MDIs.      ITP Comments:     ITP Comments      09/09/15 1110 10/14/15 1516 10/16/15 1051 10/21/15 1048 10/25/15 1101   ITP Comments Exercise and personal goals anticipated to be met in 20 more sessions. Called Mr Holden and left him a message that [place] and he could start [facility] this week. Personal and exercise goals expected to be met in 34 more sessions. Progress on specific individualized goals will  be charted in patient's ITP. Upon completion of the program the patient will be comfortable managing exercise goals and progression on their own.  Personal and exercise goals expected to be met in 32 more sessiosns. See ITP for specific notes no goal progression. Personal and exercise goals expected to be met in 30 more sessions. See ITP for specific notes on goal progression     10/29/15 0828 10/30/15 1037 11/01/15 1024       ITP Comments SOB data, Quality of Life Data, and PHQ data entered on wrong patient - not Me  Webb's date. Personal and exercise goals expected to be met in 29 more sessions. Progress on specific individualized goals will be charted in patient's ITP. Upon completion of the program the patient will be comfortable managing exercise goals and progression on their own.  Personal and exercise goals expected to be met in 27 more sessions. Progress on specific individualized goals will be charted in patient's ITP. Upon completion of the program the patient will be comfortable managing exercise goals and progression on their own.         Comments: 30 Day Review

## 2015-11-11 NOTE — Progress Notes (Signed)
Daily Session Note  Patient Details  Name: Adrian Harris. MRN: [id number] Date of Birth: August 20, 1951 Referring Provider:  Erby Pian, MD  Encounter Date: 11/11/2015  Check In:     Session Check In - 11/11/15 1040    Check-In   Location [facility]   Staff Present Carson Myrtle, BS, RRT, Respiratory Therapist;Shamiracle Gorden Amedeo Plenty, BS, ACSM CEP, Exercise Physiologist;Steven Way, BS, ACSM EP-C, Exercise Physiologist   Supervising physician immediately available to respond to emergencies [facility] immediately available ER MD   Physician(s) Owens Shark and Reita Cliche   Medication changes reported     No   Fall or balance concerns reported    No   Warm-up and Cool-down Performed on first and last piece of equipment   Resistance Training Performed Yes   VAD Patient? No   Pain Assessment   Currently in Pain? No/denies           Exercise Prescription Changes - 11/11/15 1000    Exercise Review   Progression Yes   Response to Exercise   Symptoms none   Duration Progress to 50 minutes of aerobic without signs/symptoms of physical distress   Intensity THRR unchanged   Progression   Progression Continue progressive overload as per policy without signs/symptoms or physical distress.   Resistance Training   Training Prescription Yes   Weight 4   Reps 10-12   Treadmill   MPH 3.5   Grade 1   Minutes 15   Recumbant Elliptical   Level 5   RPM 35   Watts 15   Elliptical   Level 5   Speed 4   Minutes 17   REL-XR   Level 6   Watts 90   Minutes 10   T5 Nustep   Level 6   Watts 75   Minutes 15      Goals Met:  Proper associated with RPD/PD & O2 Sat Independence with exercise equipment Exercise tolerated well Personal goals reviewed Strength training completed today  Goals Unmet:  Not Applicable  Comments: Reviewed individualized exercise prescription and made increases per departmental policy. Exercise increases were discussed with the patient and they were  able to perform the new work loads without issue (no signs or symptoms).     Dr. Emily Filbert is Medical Director for [place] and [facility].

## 2015-11-13 ENCOUNTER — Encounter: Payer: No Typology Code available for payment source | Admitting: *Deleted

## 2015-11-13 DIAGNOSIS — J449 Chronic obstructive pulmonary disease, unspecified: Secondary | ICD-10-CM | POA: Diagnosis not present

## 2015-11-13 NOTE — Progress Notes (Signed)
Daily Session Note  Patient Details  Name: Adrian Harris. MRN: [id number] Date of Birth: 1951-02-25 Referring Provider:  Erby Pian, MD  Encounter Date: 11/13/2015  Check In:     Session Check In - 11/13/15 1028    Check-In   Location [facility]   Staff Present Carson Myrtle, BS, RRT, Respiratory Therapist;Rever Pichette Dillard Essex, MS, ACSM CEP, Exercise Physiologist;Steven Way, BS, ACSM EP-C, Exercise Physiologist   Supervising physician immediately available to respond to emergencies [facility] immediately available ER MD   Physician(s) Jimmye Norman and Darl Householder   Medication changes reported     No   Fall or balance concerns reported    No   Warm-up and Cool-down Performed on first and last piece of equipment   Resistance Training Performed Yes   VAD Patient? No   Pain Assessment   Currently in Pain? No/denies   Multiple Pain Sites No         Goals Met:  Proper associated with RPD/PD & O2 Sat Independence with exercise equipment Using PLB without cueing & demonstrates good technique Exercise tolerated well Strength training completed today  Goals Unmet:  Not Applicable  Comments: Patient completed exercise prescription and all exercise goals during rehab session. The exercise was tolerated well and the patient is progressing in the program.   Dr. Emily Filbert is Medical Director for [place] and [facility].

## 2015-11-15 ENCOUNTER — Encounter: Payer: No Typology Code available for payment source | Admitting: *Deleted

## 2015-11-15 DIAGNOSIS — J449 Chronic obstructive pulmonary disease, unspecified: Secondary | ICD-10-CM | POA: Diagnosis not present

## 2015-11-15 DIAGNOSIS — J4520 Mild intermittent asthma, uncomplicated: Secondary | ICD-10-CM

## 2015-11-15 DIAGNOSIS — G4730 Sleep apnea, unspecified: Secondary | ICD-10-CM

## 2015-11-15 NOTE — Progress Notes (Signed)
Daily Session Note  Patient Details  Name: Steen Bisig. MRN: [id number] Date of Birth: [birth year] Referring Provider:  Erby Pian, MD  Encounter Date: 11/15/2015  Check In:     Session Check In - 11/15/15 1042    Check-In   Location [facility]   Staff Present Heath Lark, RN, BSN, CCRP;Stacey Blanch Media, RRT, RCP, Respiratory Therapist;Sarah-Jane Nazario, RN, BSN   Supervising physician immediately available to respond to emergencies [facility] immediately available ER MD   Physician(s) Carloyn Manner is using his walker all the time now.    Medication changes reported     No   Fall or balance concerns reported    No   Warm-up and Cool-down Performed on first and last piece of equipment   Resistance Training Performed Yes   VAD Patient? No   Pain Assessment   Currently in Pain? No/denies         Goals Met:  Proper associated with RPD/PD & O2 Sat Exercise tolerated well  Goals Unmet:  Not Applicable  Comments:    Dr. Emily Filbert is Medical Director for [place] and [facility].

## 2015-11-20 ENCOUNTER — Encounter: Payer: No Typology Code available for payment source | Admitting: *Deleted

## 2015-11-20 DIAGNOSIS — J449 Chronic obstructive pulmonary disease, unspecified: Secondary | ICD-10-CM

## 2015-11-20 NOTE — Progress Notes (Signed)
Daily Session Note  Patient Details  Name: Adrian Harris. MRN: [id number] Date of Birth: 11-Feb-1951 Referring Provider:  Erby Pian, MD  Encounter Date: 11/20/2015  Check In:     Session Check In - 11/20/15 1058    Check-In   Location [facility]   Staff Present Lestine Box, BS, ACSM EP-C, Exercise Physiologist;Adrain Butrick Dillard Essex, MS, ACSM CEP, Exercise Physiologist;Laureen Owens Shark, BS, RRT, Respiratory Therapist   Supervising physician immediately available to respond to emergencies [facility] immediately available ER MD   Physician(s) Burlene Arnt and Jimmye Norman   Medication changes reported     No   Fall or balance concerns reported    No   Warm-up and Cool-down Performed on first and last piece of equipment   Resistance Training Performed Yes   VAD Patient? No   Pain Assessment   Currently in Pain? No/denies           Exercise Prescription Changes - 11/20/15 1000    Exercise Review   Progression Yes   Response to Exercise   Symptoms None   Comments Reviewed individualized exercise prescription and made increases per departmental policy. Exercise increases were discussed with the patient and they were able to perform the new work loads without issue (no signs or symptoms).    Duration Progress to 50 minutes of aerobic without signs/symptoms of physical distress   Intensity THRR unchanged   Progression   Progression Continue progressive overload as per policy without signs/symptoms or physical distress.   Resistance Training   Training Prescription Yes   Weight 4   Reps 10-12   Treadmill   MPH 3.5   Grade 3   Minutes 15   Recumbant Elliptical   Level 5   RPM 35   Watts 15   Elliptical   Level 10   Speed 4   Minutes 17   REL-XR   Level 6   Watts 90   Minutes 10   T5 Nustep   Level 7   Watts 90   Minutes 15      Goals Met:  Proper associated with RPD/PD & O2 Sat Independence with exercise equipment Using PLB without cueing &  demonstrates good technique Exercise tolerated well Personal goals reviewed Strength training completed today  Goals Unmet:  Not Applicable  Comments: Patient completed exercise prescription and all exercise goals during rehab session. The exercise was tolerated well and the patient is progressing in the program.   Dr. Emily Filbert is Medical Director for [place] and [facility].

## 2015-11-22 ENCOUNTER — Encounter: Payer: No Typology Code available for payment source | Admitting: *Deleted

## 2015-11-22 DIAGNOSIS — J449 Chronic obstructive pulmonary disease, unspecified: Secondary | ICD-10-CM | POA: Diagnosis not present

## 2015-11-22 NOTE — Progress Notes (Signed)
Daily Session Note  Patient Details  Name: Adrian Harris. MRN: [id number] Date of Birth: 06-Dec-1950 Referring Provider:  Erby Pian, MD  Encounter Date: 11/22/2015  Check In:     Session Check In - 11/22/15 1034    Check-In   Location [facility]   Staff Present Gerlene Burdock, RN, Drusilla Kanner, MS, ACSM CEP, Exercise Physiologist;Stacey Blanch Media, RRT, RCP, Respiratory Therapist   Supervising physician immediately available to respond to emergencies [facility] immediately available ER MD   Physician(s) Clearnce Hasten and Paduchowski    Medication changes reported     No   Fall or balance concerns reported    No   Warm-up and Cool-down Performed on first and last piece of equipment   Resistance Training Performed Yes   Pain Assessment   Currently in Pain? No/denies   Multiple Pain Sites No         Goals Met:  Proper associated with RPD/PD & O2 Sat Independence with exercise equipment Using PLB without cueing & demonstrates good technique Exercise tolerated well Strength training completed today  Goals Unmet:  Not Applicable  Comments: Patient completed exercise prescription and all exercise goals during rehab session. The exercise was tolerated well and the patient is progressing in the program.    Dr. Emily Filbert is Medical Director for [place] and [facility].

## 2015-11-25 ENCOUNTER — Encounter: Payer: No Typology Code available for payment source | Attending: Specialist | Admitting: *Deleted

## 2015-11-25 DIAGNOSIS — J449 Chronic obstructive pulmonary disease, unspecified: Secondary | ICD-10-CM | POA: Diagnosis present

## 2015-11-25 DIAGNOSIS — G4730 Sleep apnea, unspecified: Secondary | ICD-10-CM

## 2015-11-25 DIAGNOSIS — J4520 Mild intermittent asthma, uncomplicated: Secondary | ICD-10-CM

## 2015-11-25 NOTE — Progress Notes (Signed)
Daily Session Note  Patient Details  Name: Adrian Harris. MRN: [id number] Date of Birth: 1950-11-13 Referring Provider:  Erby Pian, MD  Encounter Date: 11/25/2015  Check In:     Session Check In - 11/25/15 1054    Check-In   Location [facility]   Staff Present Lestine Box, BS, ACSM EP-C, Exercise Physiologist;Shulem Mader Amedeo Plenty, BS, ACSM CEP, Exercise Physiologist;Laureen Owens Shark, BS, RRT, Respiratory Therapist   Supervising physician immediately available to respond to emergencies [facility] immediately available ER MD   Physician(s) Corky Downs and Edd Fabian   Medication changes reported     No   Fall or balance concerns reported    No   Warm-up and Cool-down Performed on first and last piece of equipment   Resistance Training Performed Yes   VAD Patient? No   Pain Assessment   Currently in Pain? No/denies   Multiple Pain Sites No         Goals Met:  Proper associated with RPD/PD & O2 Sat Independence with exercise equipment Exercise tolerated well Strength training completed today  Goals Unmet:  Not Applicable  Comments: Patient completed exercise prescription and all exercise goals during rehab session. The exercise was tolerated well and the patient is progressing in the program.     Dr. Emily Filbert is Medical Director for [place] and [facility].

## 2015-11-27 ENCOUNTER — Encounter: Payer: No Typology Code available for payment source | Admitting: *Deleted

## 2015-11-27 DIAGNOSIS — J449 Chronic obstructive pulmonary disease, unspecified: Secondary | ICD-10-CM | POA: Diagnosis not present

## 2015-11-27 DIAGNOSIS — J4520 Mild intermittent asthma, uncomplicated: Secondary | ICD-10-CM

## 2015-11-27 NOTE — Progress Notes (Signed)
Daily Session Note  Patient Details  Name: Adrian W Chaddock Jr. MRN: [id number] Date of Birth: 06/25/1951 Referring Provider:  Fleming, Herbon E, MD  Encounter Date: 11/27/2015  Check In:     Session Check In - 11/27/15 1039    Check-In   Location [facility]   Staff Present Laureen Brown, BS, RRT, Respiratory Therapist;Mary Jo Abernethy, RN;Kelly Hayes, BS, ACSM CEP, Exercise Physiologist   Supervising physician immediately available to respond to emergencies [facility] immediately available ER MD   Physician(s) Dr. Taylor and Dr. Williams   Medication changes reported     No   Fall or balance concerns reported    No   Warm-up and Cool-down Performed on first and last piece of equipment   Resistance Training Performed Yes   VAD Patient? No   Pain Assessment   Currently in Pain? No/denies         Goals Met:  Proper associated with RPD/PD & O2 Sat Exercise tolerated well  Goals Unmet:  Not Applicable  Comments:    Dr. Mark Miller is Medical Director for [facility] and [facility]. 

## 2015-11-29 ENCOUNTER — Encounter: Payer: No Typology Code available for payment source | Admitting: *Deleted

## 2015-11-29 DIAGNOSIS — J449 Chronic obstructive pulmonary disease, unspecified: Secondary | ICD-10-CM

## 2015-11-29 DIAGNOSIS — G4730 Sleep apnea, unspecified: Secondary | ICD-10-CM

## 2015-11-29 NOTE — Progress Notes (Signed)
Daily Session Note  Patient Details  Name: Candice Lunney. MRN: [id number] Date of Birth: Apr 20, 1951 Referring Provider:  Erby Pian, MD  Encounter Date: 11/29/2015  Check In:     Session Check In - 11/29/15 1109    Check-In   Location [facility]   Staff Present Heath Lark, RN, BSN, CCRP;Hanley Rispoli, RN, Michaela Corner, RRT, RCP, Respiratory Therapist   Medication changes reported     No   Fall or balance concerns reported    No   Warm-up and Cool-down Performed on first and last piece of equipment   Resistance Training Performed Yes   VAD Patient? No   Pain Assessment   Currently in Pain? No/denies         Goals Met:  Proper associated with RPD/PD & O2 Sat Independence with exercise equipment Exercise tolerated well Queuing for purse lip breathing Strength training completed today  Goals Unmet:  Not Applicable  Comments:    Dr. Emily Filbert is Medical Director for [place] and [facility].

## 2015-12-02 ENCOUNTER — Encounter: Payer: No Typology Code available for payment source | Admitting: *Deleted

## 2015-12-02 DIAGNOSIS — G4730 Sleep apnea, unspecified: Secondary | ICD-10-CM

## 2015-12-02 DIAGNOSIS — J449 Chronic obstructive pulmonary disease, unspecified: Secondary | ICD-10-CM | POA: Diagnosis not present

## 2015-12-02 NOTE — Progress Notes (Signed)
Daily Session Note  Patient Details  Name: Macaulay Reicher. MRN: [id number] Date of Birth: 09-24-1950 Referring Provider:  Erby Pian, MD  Encounter Date: 12/02/2015  Check In:     Session Check In - 12/02/15 1029    Check-In   Location [facility]   Staff Present Heath Lark, RN, BSN, CCRP;Cesilia Shinn, RN, Moises Blood, BS, ACSM CEP, Exercise Physiologist   Supervising physician immediately available to respond to emergencies [facility] immediately available ER MD   Physician(s) Dr. Mariea Clonts and Dr. Corky Downs   Medication changes reported     No   Fall or balance concerns reported    No   Warm-up and Cool-down Performed on first and last piece of equipment   Resistance Training Performed Yes   VAD Patient? No   Pain Assessment   Currently in Pain? No/denies         Goals Met:  Proper associated with RPD/PD & O2 Sat Exercise tolerated well  Goals Unmet:  Not Applicable  Comments:    Dr. Emily Filbert is Medical Director for [place] and [facility].

## 2015-12-06 ENCOUNTER — Encounter: Payer: No Typology Code available for payment source | Admitting: *Deleted

## 2015-12-06 DIAGNOSIS — J449 Chronic obstructive pulmonary disease, unspecified: Secondary | ICD-10-CM

## 2015-12-06 DIAGNOSIS — G4730 Sleep apnea, unspecified: Secondary | ICD-10-CM

## 2015-12-06 DIAGNOSIS — J4520 Mild intermittent asthma, uncomplicated: Secondary | ICD-10-CM

## 2015-12-06 NOTE — Progress Notes (Signed)
Daily Session Note  Patient Details  Name: Adrian Harris. MRN: [id number] Date of Birth: 1950/11/17 Referring Provider:    Encounter Date: 12/06/2015  Check In:     Session Check In - 12/06/15 1135    Check-In   Staff Present Nyoka Cowden, RN;Carroll Enterkin, RN, BSN;Yanelie Abraha, RN, BSN, [employer] physician immediately available to respond to emergencies [facility] immediately available ER MD   Physician(s) Drs: Joni Fears and Jacqualine Code   Medication changes reported     No   Warm-up and Cool-down Performed on first and last piece of equipment   VAD Patient? No   Pain Assessment   Currently in Pain? No/denies         Goals Met:  Independence with exercise equipment Exercise tolerated well  Goals Unmet:  Not Applicable  Comments: Doing well with exercise prescription progression..   Dr. Emily Filbert is Medical Director for [place] and [facility].

## 2015-12-09 ENCOUNTER — Encounter: Payer: No Typology Code available for payment source | Admitting: *Deleted

## 2015-12-09 DIAGNOSIS — J4520 Mild intermittent asthma, uncomplicated: Secondary | ICD-10-CM

## 2015-12-09 DIAGNOSIS — J449 Chronic obstructive pulmonary disease, unspecified: Secondary | ICD-10-CM | POA: Diagnosis not present

## 2015-12-09 DIAGNOSIS — G4730 Sleep apnea, unspecified: Secondary | ICD-10-CM

## 2015-12-09 NOTE — Progress Notes (Signed)
Daily Session Note  Patient Details  Name: Adrian Harris. MRN: [id number] Date of Birth: 09-10-1950 Referring Provider:    Encounter Date: 12/09/2015  Check In:     Session Check In - 12/09/15 1047    Check-In   Location [facility]   Staff Present Gerlene Burdock, RN, BSN;Laureen Owens Shark, BS, RRT, Respiratory Therapist;Jasalyn Frysinger Amedeo Plenty, BS, ACSM CEP, Exercise Physiologist   Supervising physician immediately available to respond to emergencies [facility] immediately available ER MD   Physician(s) Edd Fabian and Corky Downs   Medication changes reported     No   Fall or balance concerns reported    No   Warm-up and Cool-down Performed on first and last piece of equipment   Resistance Training Performed Yes   VAD Patient? No   Pain Assessment   Currently in Pain? No/denies   Multiple Pain Sites No           Exercise Prescription Changes - 12/09/15 1000    Exercise Review   Progression Yes   Response to Exercise   Symptoms None   Comments Reviewed individualized exercise prescription and made increases per departmental policy. Exercise increases were discussed with the patient and they were able to perform the new work loads without issue (no signs or symptoms).    Duration Progress to 50 minutes of aerobic without signs/symptoms of physical distress   Intensity THRR unchanged   Progression   Progression Continue progressive overload as per policy without signs/symptoms or physical distress.   Resistance Training   Training Prescription Yes   Weight 4   Reps 10-12   Treadmill   MPH 3.5   Grade 3   Minutes 15   Recumbant Elliptical   Level 5   RPM 35   Watts 15   Elliptical   Level 13   Speed 4   Minutes 17   REL-XR   Level 6   Watts 90   Minutes 10   T5 Nustep   Level 7   Watts 90   Minutes 15      Goals Met:  Proper associated with RPD/PD & O2 Sat Independence with exercise equipment Exercise tolerated well Personal goals reviewed Strength  training completed today  Goals Unmet:  Not Applicable  Comments: Reviewed individualized exercise prescription and made increases per departmental policy. Exercise increases were discussed with the patient and they were able to perform the new work loads without issue (no signs or symptoms).     Dr. Emily Filbert is Medical Director for [name] and [facility].

## 2015-12-09 NOTE — Progress Notes (Signed)
Pulmonary Individual Treatment Plan  Patient Details  Name: Adrian Harris. MRN: [id number] Date of Birth: March 20, 1951 [facility]   Initial Encounter Date:       Pulmonary Rehab from 08/20/2015 in [place]   Date  08/20/15      Visit Diagnosis: COPD, mild ([place])  Sleep apnea  Asthma, mild intermittent, uncomplicated  Patient's Home Medications on Admission:  Current outpatient prescriptions:  .  albuterol (PROAIR HFA) 108 (90 BASE) MCG/ACT inhaler, Inhale into the lungs., Disp: , Rfl:  .  budesonide-formoterol (SYMBICORT) 160-4.5 MCG/ACT inhaler, Inhale into the lungs., Disp: , Rfl:  .  Cholecalciferol (VITAMIN D-1000 MAX ST) 1000 UNITS tablet, Take by mouth., Disp: , Rfl:  .  glucose blood (ACCU-CHEK COMFORT CURVE) test strip, , Disp: , Rfl:  .  lisinopril (PRINIVIL,ZESTRIL) 20 MG tablet, Take by mouth., Disp: , Rfl:  .  metFORMIN (GLUCOPHAGE-XR) 500 MG 24 hr tablet, Take by mouth., Disp: , Rfl:  .  mometasone (NASONEX) 50 MCG/ACT nasal spray, , Disp: , Rfl:  .  tiotropium (SPIRIVA) 18 MCG inhalation capsule, Place into inhaler and inhale., Disp: , Rfl:  .  vitamin B-12 (CYANOCOBALAMIN) 1000 MCG tablet, Take by mouth., Disp: , Rfl:   Past Medical History: No past medical history on file.  Tobacco Use: History  Smoking status  . Former Smoker -- 2.00 packs/day for 9 years  . Types: Cigarettes  Smokeless tobacco  . Former Systems developer  . Quit date: 11/11/1974    Labs: Recent Review Flowsheet Data    There is no flowsheet data to display.         POCT Glucose      10/16/15 1000 10/16/15 1145         POCT Blood Glucose   Pre-Exercise  129 mg/dL      Post-Exercise  121 mg/dL      Pre-Exercise #2 172 mg/dL       Post-Exercise #2 93 mg/dL          ADL UCSD:     Pulmonary Assessment Scores      08/20/15 0830 09/09/15 1000 10/23/15 1000   ADL UCSD   ADL Phase Entry Exit Exit   SOB Score total 34 53 44   Rest  0 0 0   Walk '1 1 2   ' Stairs '2 4 4   ' Bath 0 2 0   Dress 0 2 1   Shop '2 2 2     ' 10/29/15 0827 11/20/15 1000     ADL UCSD   ADL Phase Mid Mid    SOB Score total  27    Rest  0    Walk  2    Stairs  3    Bath  0    Dress  1    Shop  1       Pulmonary Function Assessment:     Pulmonary Function Assessment - 08/20/15 0830    Pulmonary Function Tests   RV% 74 %   DLCO% 94 %   Initial Spirometry Results   FVC% 94 %   FEV1% 81 %   FEV1/FVC Ratio 68   Breath   Bilateral Breath Sounds Clear   Shortness of Breath Yes;Limiting activity      Exercise Target Goals:    Exercise Program Goal: Individual exercise prescription set with THRR, safety & activity barriers. Participant demonstrates ability to understand and report RPE using BORG scale, to self-measure pulse  accurately, and to acknowledge the importance of the exercise prescription.  Exercise Prescription Goal: Starting with aerobic activity 30 plus minutes a day, 3 days per week for initial exercise prescription. Provide home exercise prescription and guidelines that participant acknowledges understanding prior to discharge.  Activity Barriers & Risk Stratification:     Activity Barriers & Cardiac Risk Stratification - 08/20/15 0830    Activity Barriers & Cardiac Risk Stratification   Activity Barriers Shortness of Breath;Arthritis;Deconditioning;Muscular Weakness   Cardiac Risk Stratification Moderate      6 Minute Walk:     6 Minute Walk      08/20/15 1004 09/16/15 1022 10/23/15 0822   6 Minute Walk   Phase Initial Mid Program --   Distance 1700 feet 1000 feet --   Walk Time 6 minutes 6 minutes --   RPE 12 13 --   Perceived Dyspnea  3 3 --   Symptoms No No No   Resting HR 74 bpm 74 bpm --   Resting BP 118/74 mmHg 124/64 mmHg --   Max Ex. HR 117 bpm 82 bpm --   Max Ex. BP 144/76 mmHg 142/74 mmHg --     10/29/15 0825 11/22/15 1040     6 Minute Walk   Phase Mid Program Mid Program    Distance --   Entered on wrong patient 1820 feet    Walk Time  6 minutes    # of Rest Breaks  0    RPE  12    Symptoms  No    Resting HR  88 bpm    Resting BP  128/88 mmHg    Max Ex. HR  108 bpm    Max Ex. BP  164/88 mmHg       Initial Exercise Prescription:     Initial Exercise Prescription - 08/20/15 1000    Date of Initial Exercise RX and Referring Provider   Date 08/20/15   Treadmill   MPH 2.5   Grade 0   Minutes 10   Recumbant Bike   Level 2   RPM 40   Watts 20   Minutes 10   NuStep   Level 2   Watts 50   Minutes 10   Arm Ergometer   Level 1   Watts 10   Minutes 10   Recumbant Elliptical   Level 2   RPM 40   Watts 20   Minutes 10   Elliptical   Level 1   Speed 3   Minutes 1   REL-XR   Level 3   Watts 50   Minutes 10   T5 Nustep   Level 1   Watts 20   Minutes 10   Biostep-RELP   Level 3   Watts 50   Minutes 10   Prescription Details   Duration Progress to 30 minutes of continuous aerobic without signs/symptoms of physical distress   Intensity   THRR REST +  30   Ratings of Perceived Exertion 11-15   Perceived Dyspnea 2-4   Progression   Progression Continue progressive overload as per policy without signs/symptoms or physical distress.   Resistance Training   Training Prescription Yes   Weight 2   Reps 10-15      Perform Capillary Blood Glucose checks as needed.  Exercise Prescription Changes:     Exercise Prescription Changes      09/09/15 1500 10/16/15 1000 10/25/15 1100 10/28/15 0622 10/30/15 1000   Exercise Review   Progression  Yes  Yes   Response to Exercise   Blood Pressure (Admit) 112/80 mmHg   126/82 mmHg    Blood Pressure (Exercise) 140/82 mmHg   142/82 mmHg    Blood Pressure (Exit) 112/68 mmHg   118/72 mmHg    Heart Rate (Admit) 95 bpm   80 bpm    Heart Rate (Exercise) 136 bpm   110 bpm    Heart Rate (Exit) 116 bpm   104 bpm    Oxygen Saturation (Admit) 97 %   99 %    Oxygen Saturation (Exercise) 99 %   98 %    Oxygen  Saturation (Exit) 97 %   97 %    Rating of Perceived Exertion (Exercise) 13   12    Perceived Dyspnea (Exercise) 4   3    Symptoms  None None none none   Comments  Second day of exercise! Patient was re-oriented to the gym and the equipment functions and settings. Procedures and policies of the gym were outlined and explained. The patient's individual exercise prescription and treatment plan were reviewed with them. All starting workloads were established based on the results of the functional testing  done at the initial intake visit. The plan for exercise progression was also reviewed and progression will be customized based on the patient's performance and goals.  Second day of exercise! Patient was re-oriented to the gym and the equipment functions and settings. Procedures and policies of the gym were outlined and explained. The patient's individual exercise prescription and treatment plan were reviewed with them. All starting workloads were established based on the results of the functional testing  done at the initial intake visit. The plan for exercise progression was also reviewed and progression will be customized based on the patient's performance and goals.   Reviewed individualized exercise prescription and made increases per departmental policy. Exercise increases were discussed with the patient and they were able to perform the new work loads without issue (no signs or symptoms).    Duration  Progress to 30 minutes of continuous aerobic without signs/symptoms of physical distress Progress to 30 minutes of continuous aerobic without signs/symptoms of physical distress Progress to 50 minutes of aerobic without signs/symptoms of physical distress Progress to 50 minutes of aerobic without signs/symptoms of physical distress   Intensity  Rest + 30 Rest + 30 THRR unchanged THRR unchanged   Progression   Progression  Continue progressive overload as per policy without signs/symptoms or physical  distress. Continue progressive overload as per policy without signs/symptoms or physical distress. Continue progressive overload as per policy without signs/symptoms or physical distress. Continue progressive overload as per policy without signs/symptoms or physical distress.   Resistance Training   Training Prescription     Yes   Weight   4  4   Reps     10-12   Training Prescription (read-only) Yes Yes      Weight (read-only) 4 4      Reps (read-only) 10-12 10-12      Treadmill   MPH   3.5 3.5 3.5   Grade    1 1   Minutes    7 15   MPH (read-only) 3 3      Grade (read-only) 0 0      Minutes (read-only) 15 15      Recumbant Elliptical   Level (read-only)  5      RPM (read-only)  35      Minutes (read-only)  15  Elliptical   Level    2 2   Speed    3.8 3.8   Minutes   '3 7 12   ' REL-XR   Level (read-only) 2 2      Watts (read-only) 70 70      Minutes (read-only) 10 10      T5 Nustep   Level    2 5   Watts    70 55   Minutes    15 15   Level (read-only) 2 2      Watts (read-only) 40 40      Minutes (read-only) 15 15        11/01/15 1000 11/04/15 1000 11/08/15 1000 11/11/15 1000 11/15/15 1000   Exercise Review   Progression Yes Yes Yes Yes Yes   Response to Exercise   Symptoms none none none none none   Comments Increased elliptical time to accomodate increase in stamina and conditioning Increases in exercise prescription were reviewed with the patient. The increases were tolerated well with no signs or symptoms.  Increased ellliptical time to 16 minutes      Duration Progress to 50 minutes of aerobic without signs/symptoms of physical distress Progress to 50 minutes of aerobic without signs/symptoms of physical distress Progress to 50 minutes of aerobic without signs/symptoms of physical distress Progress to 50 minutes of aerobic without signs/symptoms of physical distress Progress to 50 minutes of aerobic without signs/symptoms of physical distress   Intensity THRR  unchanged THRR unchanged THRR unchanged THRR unchanged THRR unchanged   Progression   Progression Continue progressive overload as per policy without signs/symptoms or physical distress. Continue progressive overload as per policy without signs/symptoms or physical distress. Continue progressive overload as per policy without signs/symptoms or physical distress. Continue progressive overload as per policy without signs/symptoms or physical distress. Continue progressive overload as per policy without signs/symptoms or physical distress.   Resistance Training   Training Prescription Yes Yes Yes Yes Yes   Weight '4 4 4 4 4   ' Reps 10-12 10-12 10-12 10-12 10-12   Treadmill   MPH 3.5 3.5 3.5 3.5 3.5   Grade '1 1 1 1 3   ' Minutes '15 15 15 15 15   ' Recumbant Elliptical   Level '5 5 5 5 5   ' RPM 35 35 35 35 35   Watts '15 15 15 15 15   ' Elliptical   Level '2 2 2 5 7   ' Speed 3.8 3.8 3.'8 4 4   ' Minutes '15 15 16 17 17   ' REL-XR   Level    6 6   Watts    90 90   Minutes    10 10   T5 Nustep   Level '5 6 6 6 7   ' Watts 55 75 75 75 90   Minutes '15 15 15 15 15     ' 11/20/15 1000 12/09/15 1000         Exercise Review   Progression Yes Yes      Response to Exercise   Blood Pressure (Admit)  140/70 mmHg      Blood Pressure (Exercise)  140/80 mmHg      Heart Rate (Admit)  94 bpm      Heart Rate (Exercise)  147 bpm      Oxygen Saturation (Admit)  98 %      Oxygen Saturation (Exercise)  95 %      Rating of Perceived Exertion (Exercise)  16  13-16 ranges depending  on equipment      Perceived Dyspnea (Exercise)  5      Symptoms None None      Comments Reviewed individualized exercise prescription and made increases per departmental policy. Exercise increases were discussed with the patient and they were able to perform the new work loads without issue (no signs or symptoms).  Reviewed individualized exercise prescription and made increases per departmental policy. Exercise increases were discussed with the  patient and they were able to perform the new work loads without issue (no signs or symptoms).       Duration Progress to 50 minutes of aerobic without signs/symptoms of physical distress Progress to 50 minutes of aerobic without signs/symptoms of physical distress      Intensity THRR unchanged THRR unchanged      Progression   Progression Continue progressive overload as per policy without signs/symptoms or physical distress. Continue progressive overload as per policy without signs/symptoms or physical distress.      Resistance Training   Training Prescription Yes Yes      Weight 4 4      Reps 10-12 10-12      Treadmill   MPH 3.5 3.5      Grade 3 6      Minutes 15 15      Recumbant Elliptical   Level 5 --      RPM 35 --      Watts 15 --      Elliptical   Level 10 13      Speed 4 4      Minutes 17 18      REL-XR   Level 6 --      Watts 90 --      Minutes 10 --      T5 Nustep   Level 7 8      Watts 90 90      Minutes 15 15         Exercise Comments:     Exercise Comments      11/11/15 1043 11/13/15 1000 12/09/15 1049       Exercise Comments Reviewed individualized exercise prescription and made increases per departmental policy. Exercise increases were discussed with the patient and they were able to perform the new work loads without issue (no signs or symptoms).  Adrian Harris started interval training on the treadmill today with increases in the grade from 3-5%, but keeping the speed at 3.5. He tolerated the training well with RPE/PD scales at 12/3. Reviewed increases in intensity on EL machine with patient. He tolerated these changes well with no signs or symptoms.         Discharge Exercise Prescription (Final Exercise Prescription Changes):     Exercise Prescription Changes - 12/09/15 1000    Exercise Review   Progression Yes   Response to Exercise   Blood Pressure (Admit) 140/70 mmHg   Blood Pressure (Exercise) 140/80 mmHg   Heart Rate (Admit) 94 bpm   Heart  Rate (Exercise) 147 bpm   Oxygen Saturation (Admit) 98 %   Oxygen Saturation (Exercise) 95 %   Rating of Perceived Exertion (Exercise) 16  13-16 ranges depending on equipment   Perceived Dyspnea (Exercise) 5   Symptoms None   Comments Reviewed individualized exercise prescription and made increases per departmental policy. Exercise increases were discussed with the patient and they were able to perform the new work loads without issue (no signs or symptoms).    Duration Progress to 50 minutes  of aerobic without signs/symptoms of physical distress   Intensity THRR unchanged   Progression   Progression Continue progressive overload as per policy without signs/symptoms or physical distress.   Resistance Training   Training Prescription Yes   Weight 4   Reps 10-12   Treadmill   MPH 3.5   Grade 6   Minutes 15   Recumbant Elliptical   Level --   RPM --   Watts --   Elliptical   Level 13   Speed 4   Minutes 18   REL-XR   Level --   Watts --   Minutes --   T5 Nustep   Level 8   Watts 90   Minutes 15       Nutrition:  Target Goals: Understanding of nutrition guidelines, daily intake of sodium <153m, cholesterol <2053m calories 30% from fat and 7% or less from saturated fats, daily to have 5 or more servings of fruits and vegetables.  Biometrics:      Post Biometrics - 08/20/15 1006     Post  Biometrics   Height '6\' 1"'  (1.854 m)   Weight 264 lb (119.75 kg)   Waist Circumference 46 inches   Hip Circumference 48 inches   Waist to Hip Ratio 0.96 %   BMI (Calculated) 34.9      Nutrition Therapy Plan and Nutrition Goals:     Nutrition Therapy & Goals - 08/20/15 0830    Nutrition Therapy   Diet Adrian Harris would like to loss weight, but he is not sure he wants to meet with the dietitian. He has cut down on his portion sizes and has recently lost 8lbs.      Nutrition Discharge: Rate Your Plate Scores:   Psychosocial: Target Goals: Acknowledge presence or absence  of depression, maximize coping skills, provide positive support system. Participant is able to verbalize types and ability to use techniques and skills needed for reducing stress and depression.  Initial Review & Psychosocial Screening:     Initial Psych Review & Screening - 08/20/15 0830    Family Dynamics   Good Support System? Yes   Comments Adrian Harris good support from his wife and children.   Barriers   Psychosocial barriers to participate in program There are no identifiable barriers or psychosocial needs.;The patient should benefit from training in stress management and relaxation.   Screening Interventions   Interventions Encouraged to exercise      Quality of Life Scores:     Quality of Life - 10/29/15 0818    Quality of Life Scores   Health/Function Pre 18.38 %   Socioeconomic Pre 28.57 %   Psych/Spiritual Pre 24.86 %   Family Pre 27.6 %   GLOBAL Pre 23.03 %      PHQ-9:     Recent Review Flowsheet Data    [facility] 08/20/2015   Decreased Interest 0 0   Down, Depressed, Hopeless 0 0   PHQ - 2 Score 0 0   Altered sleeping 0 -   Tired, decreased energy 2 -   Change in appetite 1 -   Feeling bad or failure about yourself  1 -   Trouble concentrating 1 -   Moving slowly or fidgety/restless 0 -   Suicidal thoughts 0 -   PHQ-9 Score 5 -   Difficult doing work/chores Somewhat difficult -      Psychosocial Evaluation and Intervention:     Psychosocial Evaluation - 09/09/15 1101  Psychosocial Evaluation & Interventions   Interventions Stress management education;Relaxation education;Encouraged to exercise with the program and follow exercise prescription   Comments Counselor met with Adrian. Harris today for initial psychosocial evaluation.  He is a 65 year old gentleman who has COPD and was ordered to come into this program.  He has a strong support system with a spouse of 21 years and several adult children close by.  Adrian. Harris is  also actively involved in his local faith community.  He reports sleeping well and having a good appetite.  He states hiss mood is generally positive and denies a history or current symptoms of anxiety or depression.  Adrian. Harris listed his stressors as having recently moved and his health issues currently.  He has goals to breathe better and plans to follow up working out in his home with the gym equipment he has.  Counselor encouraged him to complete this program and to participate in the psychoeducational components as well.        Psychosocial Re-Evaluation:     Psychosocial Re-Evaluation      10/21/15 1202           Psychosocial Re-Evaluation   Comments Counselor follow up with Adrian. Harris today.  He reports this program has been positive for him with benefits of increased stamina and strength.  He mentioned having some physiological symptoms of diarrhea the past two weeks off and on.  He stated if this did not improve he would see a Tax adviser.  Otherwise, his mood and his appetite, & sleep continue to be good.           Education: Education Goals: Education classes will be provided on a weekly basis, covering required topics. Participant will state understanding/return demonstration of topics presented.  Learning Barriers/Preferences:     Learning Barriers/Preferences - 08/20/15 0830    Learning Barriers/Preferences   Learning Barriers None   Learning Preferences Group Instruction;Individual Instruction;Pictoral;Skilled Demonstration;Verbal Instruction;Video;Written Material      Education Topics: Initial Evaluation Education: - Verbal, written and demonstration of respiratory meds, RPE/PD scales, oximetry and breathing techniques. Instruction on use of nebulizers and MDIs: cleaning and proper use, rinsing mouth with steroid doses and importance of monitoring MDI activations.          Pulmonary Rehab from 12/09/2015 in [facility] Cardiac and Pulmonary Rehab   Date  08/20/15   Educator   LB   Instruction Review Code  2- meets goals/outcomes      General Nutrition Guidelines/Fats and Fiber: -Group instruction provided by verbal, written material, models and posters to present the general guidelines for heart healthy nutrition. Gives an explanation and review of dietary fats and fiber.      Pulmonary Rehab from 12/09/2015 in [facility] Cardiac and Pulmonary Rehab   Date  11/25/15   Educator  CR   Instruction Review Code  2- meets goals/outcomes      Controlling Sodium/Reading Food Labels: -Group verbal and written material supporting the discussion of sodium use in heart healthy nutrition. Review and explanation with models, verbal and written materials for utilization of the food label.      Pulmonary Rehab from 12/09/2015 in [facility] Cardiac and Pulmonary Rehab   Date  12/09/15   Educator  CR   Instruction Review Code  2- meets goals/outcomes      Exercise Physiology & Risk Factors: - Group verbal and written instruction with models to review the exercise physiology of the cardiovascular system and associated critical values. Details cardiovascular  disease risk factors and the goals associated with each risk factor.      Pulmonary Rehab from 12/09/2015 in [facility] Cardiac and Pulmonary Rehab   Date  11/20/15   Educator  SW   Instruction Review Code  2- meets goals/outcomes      Aerobic Exercise & Resistance Training: - Gives group verbal and written discussion on the health impact of inactivity. On the components of aerobic and resistive training programs and the benefits of this training and how to safely progress through these programs.   Flexibility, Balance, General Exercise Guidelines: - Provides group verbal and written instruction on the benefits of flexibility and balance training programs. Provides general exercise guidelines with specific guidelines to those with heart or lung disease. Demonstration and skill practice provided.   Stress Management: - Provides group  verbal and written instruction about the health risks of elevated stress, cause of high stress, and healthy ways to reduce stress.      Pulmonary Rehab from 12/09/2015 in [facility] Cardiac and Pulmonary Rehab   Date  10/16/15   Educator  [facility]   Instruction Review Code  2- meets goals/outcomes      Depression: - Provides group verbal and written instruction on the correlation between heart/lung disease and depressed mood, treatment options, and the stigmas associated with seeking treatment.      Pulmonary Rehab from 12/09/2015 in [facility] Cardiac and Pulmonary Rehab   Date  11/27/15   Educator  Caffie Pinto   Instruction Review Code  2- meets goals/outcomes      Exercise & Equipment Safety: - Individual verbal instruction and demonstration of equipment use and safety with use of the equipment.      Pulmonary Rehab from 12/09/2015 in [facility] Cardiac and Pulmonary Rehab   Date  09/09/15   Educator  LB   Instruction Review Code  2- meets goals/outcomes      Infection Prevention: - Provides verbal and written material to individual with discussion of infection control including proper hand washing and proper equipment cleaning during exercise session.      Pulmonary Rehab from 12/09/2015 in [facility] Cardiac and Pulmonary Rehab   Date  08/20/15   Educator  LB   Instruction Review Code  2- meets goals/outcomes      Falls Prevention: - Provides verbal and written material to individual with discussion of falls prevention and safety.      Pulmonary Rehab from 12/09/2015 in [facility] Cardiac and Pulmonary Rehab   Date  08/20/15   Educator  LB   Instruction Review Code  2- meets goals/outcomes      Diabetes: - Individual verbal and written instruction to review signs/symptoms of diabetes, desired ranges of glucose level fasting, after meals and with exercise. Advice that pre and post exercise glucose checks will be done for 3 sessions at entry of program.      Pulmonary Rehab from 12/09/2015 in [facility] Cardiac and  Pulmonary Rehab   Date  09/09/15   Educator  LB   Instruction Review Code  2- meets goals/outcomes      Chronic Lung Diseases: - Group verbal and written instruction to review new updates, new respiratory medications, new advancements in procedures and treatments. Provide informative websites and "800" numbers of self-education.   Lung Procedures: - Group verbal and written instruction to describe testing methods done to diagnose lung disease. Review the outcome of test results. Describe the treatment choices: Pulmonary Function Tests, ABGs and oximetry.   Energy Conservation: - Provide group verbal  and written instruction for methods to conserve energy, plan and organize activities. Instruct on pacing techniques, use of adaptive equipment and posture/positioning to relieve shortness of breath.   Triggers: - Group verbal and written instruction to review types of environmental controls: home humidity, furnaces, filters, dust mite/pet prevention, HEPA vacuums. To discuss weather changes, air quality and the benefits of nasal washing.      Pulmonary Rehab from 12/09/2015 in [facility] Cardiac and Pulmonary Rehab   Date  09/09/15   Educator  LB   Instruction Review Code  2- meets goals/outcomes      Exacerbations: - Group verbal and written instruction to provide: warning signs, infection symptoms, calling MD promptly, preventive modes, and value of vaccinations. Review: effective airway clearance, coughing and/or vibration techniques. Create an Sports administrator.      Pulmonary Rehab from 12/09/2015 in [facility] Cardiac and Pulmonary Rehab   Date  11/04/15   Educator  LB   Instruction Review Code  2- meets goals/outcomes      Oxygen: - Individual and group verbal and written instruction on oxygen therapy. Includes supplement oxygen, available portable oxygen systems, continuous and intermittent flow rates, oxygen safety, concentrators, and Medicare reimbursement for oxygen.   Respiratory  Medications: - Group verbal and written instruction to review medications for lung disease. Drug class, frequency, complications, importance of spacers, rinsing mouth after steroid MDI's, and proper cleaning methods for nebulizers.      Pulmonary Rehab from 12/09/2015 in [facility] Cardiac and Pulmonary Rehab   Date  08/20/15   Educator  LB   Instruction Review Code  2- meets goals/outcomes      AED/CPR: - Group verbal and written instruction with the use of models to demonstrate the basic use of the AED with the basic ABC's of resuscitation.      Pulmonary Rehab from 12/09/2015 in [facility] Cardiac and Pulmonary Rehab   Date  10/25/15   Educator  CE   Instruction Review Code  2- meets goals/outcomes      Breathing Retraining: - Provides individuals verbal and written instruction on purpose, frequency, and proper technique of diaphragmatic breathing and pursed-lipped breathing. Applies individual practice skills.      Pulmonary Rehab from 12/09/2015 in [facility] Cardiac and Pulmonary Rehab   Date  08/20/15   Educator  LB   Instruction Review Code  2- meets goals/outcomes      Anatomy and Physiology of the Lungs: - Group verbal and written instruction with the use of models to provide basic lung anatomy and physiology related to function, structure and complications of lung disease.      Pulmonary Rehab from 12/09/2015 in [facility] Cardiac and Pulmonary Rehab   Date  11/15/15   Educator  SJ RRT   Instruction Review Code  2- meets goals/outcomes      Heart Failure: - Group verbal and written instruction on the basics of heart failure: signs/symptoms, treatments, explanation of ejection fraction, enlarged heart and cardiomyopathy.   Sleep Apnea: - Individual verbal and written instruction to review Obstructive Sleep Apnea. Review of risk factors, methods for diagnosing and types of masks and machines for OSA.   Anxiety: - Provides group, verbal and written instruction on the correlation between  heart/lung disease and anxiety, treatment options, and management of anxiety.   Relaxation: - Provides group, verbal and written instruction about the benefits of relaxation for patients with heart/lung disease. Also provides patients with examples of relaxation techniques.      Pulmonary Rehab from 12/09/2015 in  [place] Cardiac and Pulmonary Rehab   Date  10/30/15   Educator  [facility]   Instruction Review Code  2- Meets goals/outcomes      Knowledge Questionnaire Score:     Knowledge Questionnaire Score - 08/20/15 0830    Knowledge Questionnaire Score   Pre Score -3       Core Components/Risk Factors/Patient Goals at Admission:     Personal Goals and Risk Factors at Admission - 08/20/15 0830    Core Components/Risk Factors/Patient Goals on Admission    Weight Management Yes   Intervention (read-only) Learn and follow the exercise and diet guidelines while in the program. Utilize the nutrition and education classes to help gain knowledge of the diet and exercise expectations in the program  Adrian Harris would like to loss weight, but he is not sure he wants to meet with the dietitian. He has cut down on his portion sizes and has recently lost 8lbs.   Admit Weight 264 lb (119.75 kg)   Goal Weight: Short Term 200 lb (90.719 kg)   Sedentary Yes   Intervention (read-only) While in program, learn and follow the exercise prescription taught. Start at a low level workload and increase workload after able to maintain previous level for 30 minutes. Increase time before increasing intensity.  Adrian Harris would like to increase his exercise capacity. He has a home treadmill, stationary bike, and weights.   Improve shortness of breath with ADL's Yes   Intervention (read-only) While in program, learn and follow the exercise prescription taught. Start at a low level workload and increase workload ad advised by the exercise physiologist. Increase time before increasing intensity.  Adrian Harris goes at thing "all  out" and does experience shortness of breath. He will benefit from supervised exercise, pacing , and PLB.   Develop more efficient breathing techniques such as purse lipped breathing and diaphragmatic breathing; and practicing self-pacing with activity Yes   Intervention (read-only) While in program, learn and utilize the specific breathing techniques taught to you. Continue to practice and use the techniques as needed.  Demonstrated good technique and understanding of PLBing   Increase knowledge of respiratory medications and ability to use respiratory devices properly  Yes   Intervention (read-only) While in program, learn to administer MDI, nebulizer, and spacer properly.;Learn to take respiratory medicine as ordered.;While in program, learn to Clean MDI, nebulizers, and spacers properly.  Adrian Harris takes Spiriva, Symbicort, and Proventil MDI. Educated on these inhalers and gave Adrian Harris a spacer. Adrian Harris is also on CPAP, full mask, provided by the [place].   Diabetes Yes   Goal Blood glucose control identified by blood glucose values, HgbA1C. Participant verbalizes understanding of the signs/symptoms of hyper/hypo glycemia, proper foot care and importance of medication and nutrition plan for blood glucose control.   Intervention (read-only) Provide nutrition & aerobic exercise along with prescribed medications to achieve blood glucose in normal ranges: Fasting 65-99 mg/dL   Hypertension Yes   Goal Participant will see blood pressure controlled within the values of 140/9m/Hg or within value directed by their physician.   Intervention (read-only) Provide nutrition & aerobic exercise along with prescribed medications to achieve BP 140/90 or less.   Understand more about Heart/Pulmonary Disease. Yes   Intervention While in program utilize professionals for any questions, and attend the education sessions. Great websites to use are www.americanheart.org or www.lung.org for reliable information.  Ms  Zakarian has Mild COPD, Asthma, and OSA and is very interested in learning new knowledge  of these diseases and how to manage them.      Core Components/Risk Factors/Patient Goals Review:      Goals and Risk Factor Review      10/29/15 0851 10/30/15 1526 11/15/15 1604 11/29/15 1255 12/02/15 1105   Core Components/Risk Factors/Patient Goals Review   Personal Goals Review Sedentary  Weight Management/Obesity;Diabetes;Hypertension Develop more efficient breathing techniques such as purse lipped breathing and diaphragmatic breathing and practicing self-pacing with activity. Weight Management/Obesity;Diabetes;Hypertension   Review Eleftherios has been working exceptionally hard during class. 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 He has been using the spacer with no difficulties with his MDIs. Adrian Harris has noticed improvement with his BS since starting the program   was getting readings of 150-150m/DL now seeing 115-1466mDL. He stated he expects an improved HgbA1C. His weight has remained the same since starting.  HE had lost a significant amou of weight in the past year. HIs blood pressure readings are in good range. Adrian Harris is eating smaller portions and doing his best not to snack too often.  I reviewed PLB with Adrian. Harris while walking on the TM. Gleen reports his blood sugars have been around 120. He said he feels his clothes feel better even though the scale is flucutating. Adrian Harris's blood pressure has been very stable.    Expected Outcomes We should continue to see progression of workloads on all equipment in the next two weeks, pending regular attendance.  Weight will maintian or decrease as Adrian Harris on the nutrition and erercise guidelines woill in the program.  HIs BP and Blood sugar levels will continue to be in range using the same guidelines for weight control.  He  performes proper technique and understans when to use it.  This will help him with his SOB while exercising.       Core Components/Risk Factors/Patient Goals at Discharge (Final Review):      Goals and Risk Factor Review - 12/02/15 1105    Core Components/Risk Factors/Patient Goals Review   Personal Goals Review Weight Management/Obesity;Diabetes;Hypertension   Review Gleen reports his blood sugars have been around 120. He said he feels his clothes feel better even though the scale is flucutating. Adrian Harris's blood pressure has been very stable.       ITP Comments:     ITP Comments      09/09/15 1110 10/14/15 1516 10/16/15 1051 10/21/15 1048 10/25/15 1101   ITP Comments Exercise and personal goals anticipated to be met in 20 more sessions. Called Adrian Michalowski and left him a message that [place] and [place] could start [facility] this week. Personal and exercise goals expected to be met in 34 more sessions. Progress on specific individualized goals will be charted in patient's ITP. Upon completion of the program the patient will be comfortable managing exercise goals and progression on their own.  Personal and exercise goals expected to be met in 32 more sessiosns. See ITP for specific notes no goal progression. Personal and exercise goals expected to be met in 30 more sessions. See ITP for specific notes on goal progression     10/29/15 0828 10/30/15 1037 11/01/15 1024 12/02/15 1100     ITP Comments SOB data, Quality of Life Data, and PHQ data entered on wrong patient - not Me Strine's date. Personal and exercise goals expected to  be met in 29 more sessions. Progress on specific individualized goals will be charted in patient's ITP. Upon completion of the program the patient will be comfortable managing exercise goals and progression on their own.  Personal and exercise goals expected to be met in 27 more sessions. Progress on specific individualized goals will be charted  in patient's ITP. Upon completion of the program the patient will be comfortable managing exercise goals and progression on their own.  Gleen reports his blood sugars have been around 120. He said he feels his clothes feel better even though the scale is flucutating. Adrian Harris's blood pressure has been very stable.        Comments: 30 Day Review

## 2015-12-11 ENCOUNTER — Encounter: Payer: No Typology Code available for payment source | Admitting: *Deleted

## 2015-12-11 DIAGNOSIS — G4730 Sleep apnea, unspecified: Secondary | ICD-10-CM

## 2015-12-11 DIAGNOSIS — J449 Chronic obstructive pulmonary disease, unspecified: Secondary | ICD-10-CM | POA: Diagnosis not present

## 2015-12-11 DIAGNOSIS — J4520 Mild intermittent asthma, uncomplicated: Secondary | ICD-10-CM

## 2015-12-11 NOTE — Progress Notes (Signed)
Daily Session Note  Patient Details  Name: Adrian Harris. MRN: [id number] Date of Birth: Aug 12, 1951 Referring Provider:    Encounter Date: 12/11/2015  Check In:     Session Check In - 12/11/15 1053    Check-In   Staff Present Heath Lark, RN, BSN, CCRP;Laureen Owens Shark, BS, RRT, Respiratory Therapist;Rebecca Brayton El, DPT, [place] physician immediately available to respond to emergencies [facility] immediately available ER MD   Physician(s) Drs: Dineen Kid and Edd Fabian   Medication changes reported     No   Fall or balance concerns reported    No   Warm-up and Cool-down Performed on first and last piece of equipment   VAD Patient? No   Pain Assessment   Currently in Pain? No/denies           Exercise Prescription Changes - 12/11/15 1000    Exercise Review   Progression Yes   Response to Exercise   Rating of Perceived Exertion (Exercise) --  13-16 ranges depending on equipment   Duration Progress to 50 minutes of aerobic without signs/symptoms of physical distress   Intensity THRR unchanged   Progression   Progression Continue progressive overload as per policy without signs/symptoms or physical distress.   Resistance Training   Training Prescription Yes   Weight 4   Reps 10-12   Interval Training   Interval Training Yes   Equipment Treadmill   Comments 3.0-3.4mh 3.5-6% incline   Treadmill   MPH 3.5   Grade 6   Minutes 15   NuStep   Level 5   Elliptical   Level 13   Speed 4   Minutes 18   T5 Nustep   Level 8   Watts 95   Minutes 15      Goals Met:  Independence with exercise equipment Personal goals reviewed Strength training completed today  Goals Unmet:  Not Applicable  Comments: Doing well with exercise prescription progression.    Dr. MEmily Filbertis Medical Director for [place].

## 2015-12-13 ENCOUNTER — Encounter: Payer: No Typology Code available for payment source | Admitting: *Deleted

## 2015-12-13 DIAGNOSIS — J449 Chronic obstructive pulmonary disease, unspecified: Secondary | ICD-10-CM

## 2015-12-13 NOTE — Progress Notes (Signed)
Daily Session Note  Patient Details  Name: Adrian Harris. MRN: [id number] Date of Birth: Aug 08, 1951 Referring Provider:    Encounter Date: 12/13/2015  Check In:     Session Check In - 12/13/15 1139    Check-In   Staff Present Gerlene Burdock, RN, BSN;Susanne Bice, RN, BSN, [employer] physician immediately available to respond to emergencies [facility] immediately available ER MD   Physician(s) Marcelene Butte and [place]   Medication changes reported     No   Fall or balance concerns reported    No   Warm-up and Cool-down Performed on first and last piece of equipment   Resistance Training Performed No  Left early to plant his garden.   VAD Patient? No   Pain Assessment   Currently in Pain? No/denies         Goals Met:  Proper associated with RPD/PD & O2 Sat Exercise tolerated well  Goals Unmet:  Not Applicable  Comments:    Dr. Emily Filbert is Medical Director for [place] and [facility].

## 2015-12-16 ENCOUNTER — Encounter: Payer: No Typology Code available for payment source | Admitting: *Deleted

## 2015-12-16 DIAGNOSIS — J449 Chronic obstructive pulmonary disease, unspecified: Secondary | ICD-10-CM | POA: Diagnosis not present

## 2015-12-16 DIAGNOSIS — J4520 Mild intermittent asthma, uncomplicated: Secondary | ICD-10-CM

## 2015-12-16 DIAGNOSIS — G4730 Sleep apnea, unspecified: Secondary | ICD-10-CM

## 2015-12-16 NOTE — Progress Notes (Signed)
Daily Session Note  Patient Details  Name: Adrian Harris. MRN: [id number] Date of Birth: [birth year] Referring Provider:    Encounter Date: 12/16/2015  Check In:     Session Check In - 12/16/15 1117    Check-In   Location [facility]-Cardiac & Pulmonary Rehab   Staff Present Earlean Shawl, BS, ACSM CEP, Exercise Physiologist;Rebecca Brayton El, DPT, CEEA;Heath Lark, RN, BSN, CCRP;Laureen Owens Shark, BS, RRT, Respiratory Therapist   Supervising physician immediately available to respond to emergencies [facility] immediately available ER MD   Physician(s) Clearnce Hasten and McShane   Medication changes reported     No   Fall or balance concerns reported    No   Warm-up and Cool-down Performed on first and last piece of equipment   Resistance Training Performed Yes   VAD Patient? No   Pain Assessment   Currently in Pain? No/denies   Multiple Pain Sites No         Goals Met:  Proper associated with RPD/PD & O2 Sat Independence with exercise equipment Exercise tolerated well Strength training completed today  Goals Unmet:  Not Applicable  Comments: Patient completed exercise prescription and all exercise goals during rehab session. The exercise was tolerated well and the patient is progressing in the program.     Dr. Emily Filbert is Medical Director for [place] and [facility].

## 2015-12-18 ENCOUNTER — Encounter: Payer: No Typology Code available for payment source | Admitting: *Deleted

## 2015-12-18 DIAGNOSIS — J449 Chronic obstructive pulmonary disease, unspecified: Secondary | ICD-10-CM | POA: Diagnosis not present

## 2015-12-18 DIAGNOSIS — G4730 Sleep apnea, unspecified: Secondary | ICD-10-CM

## 2015-12-18 DIAGNOSIS — J4520 Mild intermittent asthma, uncomplicated: Secondary | ICD-10-CM

## 2015-12-18 NOTE — Progress Notes (Signed)
Daily Session Note  Patient Details  Name: Kavi Almquist. MRN: [id number] Date of Birth: Oct 12, 1950 Referring Provider:    Encounter Date: 12/18/2015  Check In:     Session Check In - 12/18/15 1122    Check-In   Staff Present Heath Lark, RN, BSN, CCRP;Laureen Owens Shark, BS, RRT, Respiratory Therapist;Rebecca Brayton El, DPT, [place] physician immediately available to respond to emergencies [facility] immediately available ER MD   Physician(s) Drs: Cinda Quest and Joni Fears   Medication changes reported     No   Fall or balance concerns reported    No   Warm-up and Cool-down Performed on first and last piece of equipment   VAD Patient? No   Pain Assessment   Currently in Pain? No/denies         Goals Met:  Independence with exercise equipment Exercise tolerated well No report of cardiac concerns or symptoms Strength training completed today  Goals Unmet:  Not Applicable  Comments: Doing well with exercise prescription progression.    Dr. Emily Filbert is Medical Director for [place] and [facility].

## 2015-12-20 ENCOUNTER — Encounter: Payer: No Typology Code available for payment source | Admitting: Respiratory Therapy

## 2015-12-20 DIAGNOSIS — J4520 Mild intermittent asthma, uncomplicated: Secondary | ICD-10-CM

## 2015-12-20 DIAGNOSIS — J449 Chronic obstructive pulmonary disease, unspecified: Secondary | ICD-10-CM

## 2015-12-20 DIAGNOSIS — G4730 Sleep apnea, unspecified: Secondary | ICD-10-CM

## 2015-12-20 NOTE — Progress Notes (Signed)
Daily Session Note  Patient Details  Name: Adrian Harris. MRN: [id number] Date of Birth: 09/10/1950 Referring Provider:    Encounter Date: 12/20/2015  Check In:     Session Check In - 12/20/15 1107    Check-In   Location [facility]-Cardiac & Pulmonary Rehab   Staff Present Heath Lark, RN, BSN, CCRP;Mary Kellie Shropshire, RN;Melburn Treiber Blanch Media, RRT, RCP, Respiratory Therapist   Supervising physician immediately available to respond to emergencies [facility] immediately available ER MD   Physician(s) Dr. Edd Fabian and Dr. Jimmye Norman   Medication changes reported     No   Fall or balance concerns reported    No   Warm-up and Cool-down Performed on first and last piece of equipment   Resistance Training Performed Yes   VAD Patient? No   Pain Assessment   Currently in Pain? No/denies         Goals Met:  Proper associated with RPD/PD & O2 Sat Independence with exercise equipment Exercise tolerated well Strength training completed today  Goals Unmet:  Not Applicable  Comments:    Dr. Emily Filbert is Medical Director for [place] and [facility].

## 2015-12-23 ENCOUNTER — Encounter: Payer: Non-veteran care | Attending: Specialist | Admitting: *Deleted

## 2015-12-23 DIAGNOSIS — G4730 Sleep apnea, unspecified: Secondary | ICD-10-CM

## 2015-12-23 DIAGNOSIS — J4520 Mild intermittent asthma, uncomplicated: Secondary | ICD-10-CM

## 2015-12-23 DIAGNOSIS — J449 Chronic obstructive pulmonary disease, unspecified: Secondary | ICD-10-CM | POA: Diagnosis present

## 2015-12-23 NOTE — Progress Notes (Signed)
Daily Session Note  Patient Details  Name: Adrian Harris. MRN: [id number] Date of Birth: [birth year] Referring Provider:    Encounter Date: 12/23/2015  Check In:     Session Check In - 12/23/15 1148    Check-In   Location [facility]-Cardiac & Pulmonary Rehab   Staff Present Carson Myrtle, BS, RRT, Respiratory Therapist;Albi Rappaport Amedeo Plenty, BS, ACSM CEP, Exercise Physiologist;Carroll Enterkin, RN, BSN   Supervising physician immediately available to respond to emergencies [facility] immediately available ER MD   Physician(s) Joni Fears and Burlene Arnt   Medication changes reported     No   Fall or balance concerns reported    No   Warm-up and Cool-down Performed on first and last piece of equipment   Resistance Training Performed Yes   VAD Patient? No   Pain Assessment   Currently in Pain? No/denies   Multiple Pain Sites No           Exercise Prescription Changes - 12/23/15 1100    Exercise Review   Progression Yes   Response to Exercise   Rating of Perceived Exertion (Exercise) --  13-16 ranges depending on equipment   Duration Progress to 50 minutes of aerobic without signs/symptoms of physical distress   Intensity THRR unchanged   Progression   Progression Continue progressive overload as per policy without signs/symptoms or physical distress.   Resistance Training   Training Prescription Yes   Weight 4   Reps 10-12   Interval Training   Interval Training Yes   Equipment Treadmill   Comments 3.0-3.49mh 3.5-6% incline   Treadmill   MPH 3.5   Grade 6   Minutes 15   NuStep   Level 5   Elliptical   Level 14   Speed 4   Minutes 18   T5 Nustep   Level 8   Watts 95   Minutes 15      Goals Met:  Proper associated with RPD/PD & O2 Sat Independence with exercise equipment Exercise tolerated well Personal goals reviewed Strength training completed today  Goals Unmet:  Not Applicable  Comments: Reviewed individualized exercise prescription and made increases per  departmental policy. Exercise increases were discussed with the patient and they were able to perform the new work loads without issue (no signs or symptoms).     Dr. MEmily Filbertis Medical Director for [place].

## 2015-12-27 ENCOUNTER — Encounter: Payer: Non-veteran care | Admitting: *Deleted

## 2015-12-27 DIAGNOSIS — G4730 Sleep apnea, unspecified: Secondary | ICD-10-CM

## 2015-12-27 DIAGNOSIS — J449 Chronic obstructive pulmonary disease, unspecified: Secondary | ICD-10-CM | POA: Diagnosis not present

## 2015-12-27 DIAGNOSIS — J4520 Mild intermittent asthma, uncomplicated: Secondary | ICD-10-CM

## 2015-12-27 NOTE — Progress Notes (Signed)
Daily Session Note  Patient Details  Name: Lorenz Donley. MRN: [id number] Date of Birth: 11/05/1950 Referring Provider:    Encounter Date: 12/27/2015  Check In:     Session Check In - 12/27/15 1121    Check-In   Staff Present Heath Lark, RN, BSN, CCRP;Carroll Enterkin, RN, Michaela Corner, RRT, RCP, Respiratory Therapist   Supervising physician immediately available to respond to emergencies [facility] immediately available ER MD   Physician(s) Drs: Edd Fabian and Mariea Clonts   Medication changes reported     No   Fall or balance concerns reported    No   Warm-up and Cool-down Performed on first and last piece of equipment   VAD Patient? No   Pain Assessment   Currently in Pain? No/denies         Goals Met:  Proper associated with RPD/PD & O2 Sat Independence with exercise equipment Exercise tolerated well  Goals Unmet:  Not Applicable  Comments: Doing well with exercise prescription progression.    Dr. Emily Filbert is Medical Director for [place] and [facility].

## 2015-12-30 ENCOUNTER — Encounter: Payer: Non-veteran care | Admitting: *Deleted

## 2015-12-30 DIAGNOSIS — J449 Chronic obstructive pulmonary disease, unspecified: Secondary | ICD-10-CM | POA: Diagnosis not present

## 2015-12-30 NOTE — Progress Notes (Signed)
Daily Session Note  Patient Details  Name: Adrian Harris. MRN: [id number] Date of Birth: [birth year] Referring Provider:    Encounter Date: 12/30/2015  Check In:     Session Check In - 12/30/15 1125    Check-In   Location [facility]-Cardiac & Pulmonary Rehab   Staff Present Carson Myrtle, BS, RRT, Respiratory Bertis Ruddy, BS, ACSM CEP, Exercise Physiologist;Carely Nappier, RN, BSN   Supervising physician immediately available to respond to emergencies [facility] immediately available ER MD   Physician(s) Dr. Reita Cliche and Dr. Claudette Head   Medication changes reported     No   Fall or balance concerns reported    No   Warm-up and Cool-down Performed on first and last piece of equipment   Resistance Training Performed No   VAD Patient? No   Pain Assessment   Currently in Pain? No/denies         Goals Met:  Proper associated with RPD/PD & O2 Sat Exercise tolerated well  Goals Unmet:  Not Applicable  Comments:    Dr. Emily Filbert is Medical Director for [place] and [facility].

## 2016-01-01 ENCOUNTER — Encounter: Payer: Non-veteran care | Admitting: *Deleted

## 2016-01-01 DIAGNOSIS — G4730 Sleep apnea, unspecified: Secondary | ICD-10-CM

## 2016-01-01 DIAGNOSIS — J4520 Mild intermittent asthma, uncomplicated: Secondary | ICD-10-CM

## 2016-01-01 DIAGNOSIS — J449 Chronic obstructive pulmonary disease, unspecified: Secondary | ICD-10-CM | POA: Diagnosis not present

## 2016-01-01 NOTE — Progress Notes (Signed)
Daily Session Note  Patient Details  Name: Adrian Harris. MRN: [id number] Date of Birth: Feb 17, 1951 Referring Provider:    Encounter Date: 01/01/2016  Check In:     Session Check In - 01/01/16 1111    Check-In   Staff Present Heath Lark, RN, BSN, CCRP;Carroll Enterkin, RN, BSN;Laureen Owens Shark, BS, RRT, Respiratory Therapist   Supervising physician immediately available to respond to emergencies [facility] immediately available ER MD   Physician(s) Drs: Lovena Le and Jimmye Norman   Medication changes reported     No   Fall or balance concerns reported    No   Warm-up and Cool-down Performed on first and last piece of equipment   VAD Patient? No   Pain Assessment   Currently in Pain? No/denies         Goals Met:  Independence with exercise equipment Exercise tolerated well Strength training completed today  Goals Unmet:  Not Applicable  Comments: Doing well with exercise prescription progression.    Dr. Emily Filbert is Medical Director for [place] and [facility].

## 2016-01-03 ENCOUNTER — Encounter: Payer: Non-veteran care | Admitting: *Deleted

## 2016-01-03 DIAGNOSIS — J449 Chronic obstructive pulmonary disease, unspecified: Secondary | ICD-10-CM | POA: Diagnosis not present

## 2016-01-03 DIAGNOSIS — J4520 Mild intermittent asthma, uncomplicated: Secondary | ICD-10-CM

## 2016-01-03 DIAGNOSIS — G4730 Sleep apnea, unspecified: Secondary | ICD-10-CM

## 2016-01-03 NOTE — Progress Notes (Signed)
Daily Session Note  Patient Details  Name: Adrian Harris. MRN: [id number] Date of Birth: Jul 15, 1951 Referring Provider:    Encounter Date: 01/03/2016  Check In:     Session Check In - 01/03/16 1054    Check-In   Staff Present Heath Lark, RN, BSN, CCRP;Carroll Enterkin, RN, Moises Blood, BS, ACSM CEP, Exercise Physiologist   Supervising physician immediately available to respond to emergencies [facility] immediately available ER MD   Physician(s) Drs: Marcelene Butte and Paduchowski   Medication changes reported     No   Fall or balance concerns reported    No   Warm-up and Cool-down Performed on first and last piece of equipment   Pain Assessment   Currently in Pain? No/denies         Goals Met:  Proper associated with RPD/PD & O2 Sat Independence with exercise equipment Exercise tolerated well Strength training completed today  Goals Unmet:  Not Applicable  Comments: Doing well with exercise prescription progression.    Dr. Emily Filbert is Medical Director for [facility] and [facility].

## 2016-01-06 DIAGNOSIS — J449 Chronic obstructive pulmonary disease, unspecified: Secondary | ICD-10-CM

## 2016-01-06 DIAGNOSIS — J4520 Mild intermittent asthma, uncomplicated: Secondary | ICD-10-CM

## 2016-01-06 NOTE — Addendum Note (Signed)
Addended by: Earlean Shawl T on: 01/06/2016 12:03 PM   Modules accepted: Orders

## 2016-01-06 NOTE — Progress Notes (Signed)
Pulmonary Individual Treatment Plan  Patient Details  Name: Adrian W Whetsell Jr. MRN: [id number] Date of Birth: 10/29/1950 [facility] Provider: [facility]   Initial Encounter Date:       Pulmonary Rehab from 08/20/2015 in [facility]   Date  08/20/15      Visit Diagnosis: COPD, mild (HCC)  Asthma, mild intermittent, uncomplicated  Patient's Home Medications on Admission:  Current outpatient prescriptions:  .  albuterol (PROAIR HFA) 108 (90 BASE) MCG/ACT inhaler, Inhale into the lungs., Disp: , Rfl:  .  budesonide-formoterol (SYMBICORT) 160-4.5 MCG/ACT inhaler, Inhale into the lungs., Disp: , Rfl:  .  Cholecalciferol (VITAMIN D-1000 MAX ST) 1000 UNITS tablet, Take by mouth., Disp: , Rfl:  .  glucose blood (ACCU-CHEK COMFORT CURVE) test strip, , Disp: , Rfl:  .  lisinopril (PRINIVIL,ZESTRIL) 20 MG tablet, Take by mouth., Disp: , Rfl:  .  metFORMIN (GLUCOPHAGE-XR) 500 MG 24 hr tablet, Take by mouth., Disp: , Rfl:  .  mometasone (NASONEX) 50 MCG/ACT nasal spray, , Disp: , Rfl:  .  tiotropium (SPIRIVA) 18 MCG inhalation capsule, Place into inhaler and inhale., Disp: , Rfl:  .  vitamin B-12 (CYANOCOBALAMIN) 1000 MCG tablet, Take by mouth., Disp: , Rfl:   Past Medical History: No past medical history on file.  Tobacco Use: History  Smoking status  . Former Smoker -- 2.00 packs/day for 9 years  . Types: Cigarettes  Smokeless tobacco  . Former User  . Quit date: 11/11/1974    Labs: Recent Review Flowsheet Data    There is no flowsheet data to display.         POCT Glucose      10/16/15 1000 10/16/15 1145         POCT Blood Glucose   Pre-Exercise  129 mg/dL      Post-Exercise  121 mg/dL      Pre-Exercise #2 172 mg/dL       Post-Exercise #2 93 mg/dL          ADL UCSD:     Pulmonary Assessment Scores      08/20/15 0830 09/09/15 1000 10/23/15 1000   ADL UCSD   ADL Phase Entry Exit Exit   SOB Score total 34 53 44   Rest 0 0 0   Walk 1  1 2   Stairs 2 4 4   Bath 0 2 0   Dress 0 2 1   Shop 2 2 2     10/29/15 0827 11/20/15 1000     ADL UCSD   ADL Phase Mid Mid    SOB Score total  27    Rest  0    Walk  2    Stairs  3    Bath  0    Dress  1    Shop  1       Pulmonary Function Assessment:     Pulmonary Function Assessment - 08/20/15 0830    Pulmonary Function Tests   RV% 74 %   DLCO% 94 %   Initial Spirometry Results   FVC% 94 %   FEV1% 81 %   FEV1/FVC Ratio 68   Breath   Bilateral Breath Sounds Clear   Shortness of Breath Yes;Limiting activity      Exercise Target Goals:    Exercise Program Goal: Individual exercise prescription set with THRR, safety & activity barriers. Participant demonstrates ability to understand and report RPE using BORG scale, to self-measure pulse accurately, and to   acknowledge the importance of the exercise prescription.  Exercise Prescription Goal: Starting with aerobic activity 30 plus minutes a day, 3 days per week for initial exercise prescription. Provide home exercise prescription and guidelines that participant acknowledges understanding prior to discharge.  Activity Barriers & Risk Stratification:     Activity Barriers & Cardiac Risk Stratification - 08/20/15 0830    Activity Barriers & Cardiac Risk Stratification   Activity Barriers Shortness of Breath;Arthritis;Deconditioning;Muscular Weakness   Cardiac Risk Stratification Moderate      6 Minute Walk:     6 Minute Walk      08/20/15 1004 09/16/15 1022 10/23/15 0822   6 Minute Walk   Phase Initial Mid Program --   Distance 1700 feet 1000 feet --   Walk Time 6 minutes 6 minutes --   RPE 12 13 --   Perceived Dyspnea  3 3 --   Symptoms No No No   Resting HR 74 bpm 74 bpm --   Resting BP 118/74 mmHg 124/64 mmHg --   Max Ex. HR 117 bpm 82 bpm --   Max Ex. BP 144/76 mmHg 142/74 mmHg --     10/29/15 0825 11/22/15 1040     6 Minute Walk   Phase Mid Program Mid Program    Distance --  Entered on wrong  patient 1820 feet    Walk Time  6 minutes    # of Rest Breaks  0    RPE  12    Symptoms  No    Resting HR  88 bpm    Resting BP  128/88 mmHg    Max Ex. HR  108 bpm    Max Ex. BP  164/88 mmHg       Initial Exercise Prescription:     Initial Exercise Prescription - 08/20/15 1000    Date of Initial Exercise RX and Referring Provider   Date 08/20/15   Treadmill   MPH 2.5   Grade 0   Minutes 10   Recumbant Bike   Level 2   RPM 40   Watts 20   Minutes 10   NuStep   Level 2   Watts 50   Minutes 10   Arm Ergometer   Level 1   Watts 10   Minutes 10   Recumbant Elliptical   Level 2   RPM 40   Watts 20   Minutes 10   Elliptical   Level 1   Speed 3   Minutes 1   REL-XR   Level 3   Watts 50   Minutes 10   T5 Nustep   Level 1   Watts 20   Minutes 10   Biostep-RELP   Level 3   Watts 50   Minutes 10   Prescription Details   Duration Progress to 30 minutes of continuous aerobic without signs/symptoms of physical distress   Intensity   THRR REST +  30   Ratings of Perceived Exertion 11-15   Perceived Dyspnea 2-4   Progression   Progression Continue progressive overload as per policy without signs/symptoms or physical distress.   Resistance Training   Training Prescription Yes   Weight 2   Reps 10-15      Perform Capillary Blood Glucose checks as needed.  Exercise Prescription Changes:     Exercise Prescription Changes      09/09/15 1500 10/16/15 1000 10/25/15 1100 10/28/15 0622 10/30/15 1000   Exercise Review   Progression   Yes  Yes     Response to Exercise   Blood Pressure (Admit) 112/80 mmHg   126/82 mmHg    Blood Pressure (Exercise) 140/82 mmHg   142/82 mmHg    Blood Pressure (Exit) 112/68 mmHg   118/72 mmHg    Heart Rate (Admit) 95 bpm   80 bpm    Heart Rate (Exercise) 136 bpm   110 bpm    Heart Rate (Exit) 116 bpm   104 bpm    Oxygen Saturation (Admit) 97 %   99 %    Oxygen Saturation (Exercise) 99 %   98 %    Oxygen Saturation (Exit) 97 %    97 %    Rating of Perceived Exertion (Exercise) 13   12    Perceived Dyspnea (Exercise) 4   3    Symptoms  None None none none   Comments  Second day of exercise! Patient was re-oriented to the gym and the equipment functions and settings. Procedures and policies of the gym were outlined and explained. The patient's individual exercise prescription and treatment plan were reviewed with them. All starting workloads were established based on the results of the functional testing  done at the initial intake visit. The plan for exercise progression was also reviewed and progression will be customized based on the patient's performance and goals.  Second day of exercise! Patient was re-oriented to the gym and the equipment functions and settings. Procedures and policies of the gym were outlined and explained. The patient's individual exercise prescription and treatment plan were reviewed with them. All starting workloads were established based on the results of the functional testing  done at the initial intake visit. The plan for exercise progression was also reviewed and progression will be customized based on the patient's performance and goals.   Reviewed individualized exercise prescription and made increases per departmental policy. Exercise increases were discussed with the patient and they were able to perform the new work loads without issue (no signs or symptoms).    Duration  Progress to 30 minutes of continuous aerobic without signs/symptoms of physical distress Progress to 30 minutes of continuous aerobic without signs/symptoms of physical distress Progress to 50 minutes of aerobic without signs/symptoms of physical distress Progress to 50 minutes of aerobic without signs/symptoms of physical distress   Intensity  Rest + 30 Rest + 30 THRR unchanged THRR unchanged   Progression   Progression  Continue progressive overload as per policy without signs/symptoms or physical distress. Continue progressive  overload as per policy without signs/symptoms or physical distress. Continue progressive overload as per policy without signs/symptoms or physical distress. Continue progressive overload as per policy without signs/symptoms or physical distress.   Resistance Training   Training Prescription     Yes   Weight   4  4   Reps     10-12   Training Prescription (read-only) Yes Yes      Weight (read-only) 4 4      Reps (read-only) 10-12 10-12      Treadmill   MPH   3.5 3.5 3.5   Grade    1 1   Minutes    15 15   MPH (read-only) 3 3      Grade (read-only) 0 0      Minutes (read-only) 15 15      Recumbant Elliptical   Level (read-only)  5      RPM (read-only)  35      Minutes (read-only)  15      Elliptical  Level    2 2   Speed    3.8 3.8   Minutes   3 7 12   REL-XR   Level (read-only) 2 2      Watts (read-only) 70 70      Minutes (read-only) 10 10      T5 Nustep   Level    2 5   Watts    70 55   Minutes    15 15   Level (read-only) 2 2      Watts (read-only) 40 40      Minutes (read-only) 15 15        11/01/15 1000 11/04/15 1000 11/08/15 1000 11/11/15 1000 11/15/15 1000   Exercise Review   Progression Yes Yes Yes Yes Yes   Response to Exercise   Symptoms none none none none none   Comments Increased elliptical time to accomodate increase in stamina and conditioning Increases in exercise prescription were reviewed with the patient. The increases were tolerated well with no signs or symptoms.  Increased ellliptical time to 16 minutes      Duration Progress to 50 minutes of aerobic without signs/symptoms of physical distress Progress to 50 minutes of aerobic without signs/symptoms of physical distress Progress to 50 minutes of aerobic without signs/symptoms of physical distress Progress to 50 minutes of aerobic without signs/symptoms of physical distress Progress to 50 minutes of aerobic without signs/symptoms of physical distress   Intensity THRR unchanged THRR unchanged THRR  unchanged THRR unchanged THRR unchanged   Progression   Progression Continue progressive overload as per policy without signs/symptoms or physical distress. Continue progressive overload as per policy without signs/symptoms or physical distress. Continue progressive overload as per policy without signs/symptoms or physical distress. Continue progressive overload as per policy without signs/symptoms or physical distress. Continue progressive overload as per policy without signs/symptoms or physical distress.   Resistance Training   Training Prescription Yes Yes Yes Yes Yes   Weight 4 4 4 4 4   Reps 10-12 10-12 10-12 10-12 10-12   Treadmill   MPH 3.5 3.5 3.5 3.5 3.5   Grade 1 1 1 1 3   Minutes 15 15 15 15 15   Recumbant Elliptical   Level 5 5 5 5 5   RPM 35 35 35 35 35   Watts 15 15 15 15 15   Elliptical   Level 2 2 2 5 7   Speed 3.8 3.8 3.8 4 4   Minutes 15 15 16 17 17   REL-XR   Level    6 6   Watts    90 90   Minutes    10 10   T5 Nustep   Level 5 6 6 6 7   Watts 55 75 75 75 90   Minutes 15 15 15 15 15     11/20/15 1000 12/09/15 1000 12/11/15 1000 12/23/15 1100 12/31/15 0700   Exercise Review   Progression Yes Yes Yes Yes Yes   Response to Exercise   Blood Pressure (Admit)  140/70 mmHg   138/68 mmHg  Data from 12/30/2015   Blood Pressure (Exercise)  140/80 mmHg      Blood Pressure (Exit)     110/72 mmHg   Heart Rate (Admit)  94 bpm   85 bpm   Heart Rate (Exercise)  147 bpm   135 bpm   Heart Rate (Exit)     92 bpm   Oxygen Saturation (Admit)  98 %     98 %   Oxygen Saturation (Exercise)  95 %   96 %   Oxygen Saturation (Exit)     95 %   Rating of Perceived Exertion (Exercise)  16  13-16 ranges depending on equipment --  13-16 ranges depending on equipment --  13-16 ranges depending on equipment 14  13-16 ranges depending on equipment   Perceived Dyspnea (Exercise)  5   3   Symptoms None None   None   Comments Reviewed individualized exercise prescription and made increases  per departmental policy. Exercise increases were discussed with the patient and they were able to perform the new work loads without issue (no signs or symptoms).  Reviewed individualized exercise prescription and made increases per departmental policy. Exercise increases were discussed with the patient and they were able to perform the new work loads without issue (no signs or symptoms).       Duration Progress to 50 minutes of aerobic without signs/symptoms of physical distress Progress to 50 minutes of aerobic without signs/symptoms of physical distress Progress to 50 minutes of aerobic without signs/symptoms of physical distress Progress to 50 minutes of aerobic without signs/symptoms of physical distress    Intensity THRR unchanged THRR unchanged THRR unchanged THRR unchanged THRR unchanged   Progression   Progression Continue progressive overload as per policy without signs/symptoms or physical distress. Continue progressive overload as per policy without signs/symptoms or physical distress. Continue progressive overload as per policy without signs/symptoms or physical distress. Continue progressive overload as per policy without signs/symptoms or physical distress.    Resistance Training   Training Prescription Yes Yes Yes Yes Yes   Weight 4 4 4 4 5   Reps 10-12 10-12 10-12 10-12 10-12   Interval Training   Interval Training   Yes Yes Yes   Equipment   Treadmill Treadmill Treadmill   Comments   3.0-3.5mph 3.5-6% incline 3.0-3.5mph 3.5-6% incline 3.0-3.5mph 3.5-6% incline   Treadmill   MPH 3.5 3.5 3.5 3.5 3.5   Grade 3 6 6 6 6   Minutes 15 15 15 15 15   NuStep   Level   5 5    Recumbant Elliptical   Level 5 --      RPM 35 --      Watts 15 --      Elliptical   Level 10 13 13 14 14   Speed 4 4 4 4 4   Minutes 17 18 18 18 20   REL-XR   Level 6 --      Watts 90 --      Minutes 10 --      T5 Nustep   Level 7 8 8 8 8   Watts 90 90 95 95 90   Minutes 15 15 15 15 15      Exercise  Comments:     Exercise Comments      11/11/15 1043 11/13/15 1000 12/09/15 1049 12/23/15 1151 12/27/15 1122   Exercise Comments Reviewed individualized exercise prescription and made increases per departmental policy. Exercise increases were discussed with the patient and they were able to perform the new work loads without issue (no signs or symptoms).  Mr Latin started interval training on the treadmill today with increases in the grade from 3-5%, but keeping the speed at 3.5. He tolerated the training well with RPE/PD scales at 12/3. Reviewed increases in intensity on EL machine with patient. He tolerated these changes well with no signs or symptoms.  Reviewed individualized exercise   prescription and made increases per departmental policy. Exercise increases were discussed with the patient and they were able to perform the new work loads without issue (no signs or symptoms).  No resistive work todat. HAd worked in yard yesterday and shoulders are sore.      Discharge Exercise Prescription (Final Exercise Prescription Changes):     Exercise Prescription Changes - 12/31/15 0700    Exercise Review   Progression Yes   Response to Exercise   Blood Pressure (Admit) 138/68 mmHg  Data from 12/30/2015   Blood Pressure (Exit) 110/72 mmHg   Heart Rate (Admit) 85 bpm   Heart Rate (Exercise) 135 bpm   Heart Rate (Exit) 92 bpm   Oxygen Saturation (Admit) 98 %   Oxygen Saturation (Exercise) 96 %   Oxygen Saturation (Exit) 95 %   Rating of Perceived Exertion (Exercise) 14  13-16 ranges depending on equipment   Perceived Dyspnea (Exercise) 3   Symptoms None   Intensity THRR unchanged   Resistance Training   Training Prescription Yes   Weight 5   Reps 10-12   Interval Training   Interval Training Yes   Equipment Treadmill   Comments 3.0-3.5mph 3.5-6% incline   Treadmill   MPH 3.5   Grade 6   Minutes 15   Elliptical   Level 14   Speed 4   Minutes 20   T5 Nustep   Level 8   Watts 90    Minutes 15       Nutrition:  Target Goals: Understanding of nutrition guidelines, daily intake of sodium <1500mg, cholesterol <200mg, calories 30% from fat and 7% or less from saturated fats, daily to have 5 or more servings of fruits and vegetables.  Biometrics:      Post Biometrics - 08/20/15 1006     Post  Biometrics   Height 6' 1" (1.854 m)   Weight 264 lb (119.75 kg)   Waist Circumference 46 inches   Hip Circumference 48 inches   Waist to Hip Ratio 0.96 %   BMI (Calculated) 34.9      Nutrition Therapy Plan and Nutrition Goals:     Nutrition Therapy & Goals - 08/20/15 0830    Nutrition Therapy   Diet Mr Speaker would like to loss weight, but he is not sure he wants to meet with the dietitian. He has cut down on his portion sizes and has recently lost 8lbs.      Nutrition Discharge: Rate Your Plate Scores:   Psychosocial: Target Goals: Acknowledge presence or absence of depression, maximize coping skills, provide positive support system. Participant is able to verbalize types and ability to use techniques and skills needed for reducing stress and depression.  Initial Review & Psychosocial Screening:     Initial Psych Review & Screening - 08/20/15 0830    Family Dynamics   Good Support System? Yes   Comments Mr Stancil has good support from his wife and children.   Barriers   Psychosocial barriers to participate in program There are no identifiable barriers or psychosocial needs.;The patient should benefit from training in stress management and relaxation.   Screening Interventions   Interventions Encouraged to exercise      Quality of Life Scores:     Quality of Life - 10/29/15 0818    Quality of Life Scores   Health/Function Pre 18.38 %   Socioeconomic Pre 28.57 %   Psych/Spiritual Pre 24.86 %   Family Pre 27.6 %   GLOBAL Pre 23.03 %        PHQ-9:     Recent Review Flowsheet Data    Depression screen PHQ 2/9 10/23/2015 08/20/2015   Decreased  Interest 0 0   Down, Depressed, Hopeless 0 0   PHQ - 2 Score 0 0   Altered sleeping 0 -   Tired, decreased energy 2 -   Change in appetite 1 -   Feeling bad or failure about yourself  1 -   Trouble concentrating 1 -   Moving slowly or fidgety/restless 0 -   Suicidal thoughts 0 -   PHQ-9 Score 5 -   Difficult doing work/chores Somewhat difficult -      Psychosocial Evaluation and Intervention:     Psychosocial Evaluation - 01/01/16 1033    Discharge Psychosocial Assessment & Intervention   Comments Counselor follow up with Ms. Domagalski reporting he is doing so much better than he was when he came to pulmonary rehab (3) months ago.  He reports breathing better and being able to work in his yard more.  Mr. Hobson states he is sleeping well, his appetite is good and his mood is extremely positive.  He reports no stress in his life currently and he plans to continue working out consistently upon discharge from this program.  Mr. Valvano was commended for his hard work and the progress made.        Psychosocial Re-Evaluation:     Psychosocial Re-Evaluation      10/21/15 1202           Psychosocial Re-Evaluation   Comments Counselor follow up with Mr. Burruss today.  He reports this program has been positive for him with benefits of increased stamina and strength.  He mentioned having some physiological symptoms of diarrhea the past two weeks off and on.  He stated if this did not improve he would see a Doctor.  Otherwise, his mood and his appetite, & sleep continue to be good.           Education: Education Goals: Education classes will be provided on a weekly basis, covering required topics. Participant will state understanding/return demonstration of topics presented.  Learning Barriers/Preferences:     Learning Barriers/Preferences - 08/20/15 0830    Learning Barriers/Preferences   Learning Barriers None   Learning Preferences Group Instruction;Individual  Instruction;Pictoral;Skilled Demonstration;Verbal Instruction;Video;Written Material      Education Topics: Initial Evaluation Education: - Verbal, written and demonstration of respiratory meds, RPE/PD scales, oximetry and breathing techniques. Instruction on use of nebulizers and MDIs: cleaning and proper use, rinsing mouth with steroid doses and importance of monitoring MDI activations.          Pulmonary Rehab from 01/01/2016 in [facility]   Date  08/20/15   Educator  LB   Instruction Review Code  2- meets goals/outcomes      General Nutrition Guidelines/Fats and Fiber: -Group instruction provided by verbal, written material, models and posters to present the general guidelines for heart healthy nutrition. Gives an explanation and review of dietary fats and fiber.      Pulmonary Rehab from 01/01/2016 in [facility]   Date  11/25/15   Educator  CR   Instruction Review Code  2- meets goals/outcomes      Controlling Sodium/Reading Food Labels: -Group verbal and written material supporting the discussion of sodium use in heart healthy nutrition. Review and explanation with models, verbal and written materials for utilization of the food label.      Pulmonary Rehab from 01/01/2016   in [facility]   Date  12/09/15   Educator  CR   Instruction Review Code  2- meets goals/outcomes      Exercise Physiology & Risk Factors: - Group verbal and written instruction with models to review the exercise physiology of the cardiovascular system and associated critical values. Details cardiovascular disease risk factors and the goals associated with each risk factor.      Pulmonary Rehab from 01/01/2016 in [facility]   Date  11/20/15   Educator  SW   Instruction Review Code  2- meets goals/outcomes      Aerobic Exercise & Resistance Training: - Gives group verbal and written discussion on the health impact of  inactivity. On the components of aerobic and resistive training programs and the benefits of this training and how to safely progress through these programs.   Flexibility, Balance, General Exercise Guidelines: - Provides group verbal and written instruction on the benefits of flexibility and balance training programs. Provides general exercise guidelines with specific guidelines to those with heart or lung disease. Demonstration and skill practice provided.      Pulmonary Rehab from 01/01/2016 in [facility]   Date  01/01/16   Educator  BS   Instruction Review Code  2- meets goals/outcomes      Stress Management: - Provides group verbal and written instruction about the health risks of elevated stress, cause of high stress, and healthy ways to reduce stress.      Pulmonary Rehab from 01/01/2016 in [facility]   Date  10/16/15   Educator  KC   Instruction Review Code  2- meets goals/outcomes      Depression: - Provides group verbal and written instruction on the correlation between heart/lung disease and depressed mood, treatment options, and the stigmas associated with seeking treatment.      Pulmonary Rehab from 01/01/2016 in [facility]   Date  11/27/15   Educator  [place]   Instruction Review Code  2- meets goals/outcomes      Exercise & Equipment Safety: - Individual verbal instruction and demonstration of equipment use and safety with use of the equipment.      Pulmonary Rehab from 01/01/2016 in [facility]   Date  09/09/15   Educator  LB   Instruction Review Code  2- meets goals/outcomes      Infection Prevention: - Provides verbal and written material to individual with discussion of infection control including proper hand washing and proper equipment cleaning during exercise session.      Pulmonary Rehab from 01/01/2016 in [facility]   Date  08/20/15   Educator  LB    Instruction Review Code  2- meets goals/outcomes      Falls Prevention: - Provides verbal and written material to individual with discussion of falls prevention and safety.      Pulmonary Rehab from 01/01/2016 in [facility]   Date  08/20/15   Educator  LB   Instruction Review Code  2- meets goals/outcomes      Diabetes: - Individual verbal and written instruction to review signs/symptoms of diabetes, desired ranges of glucose level fasting, after meals and with exercise. Advice that pre and post exercise glucose checks will be done for 3 sessions at entry of program.      Pulmonary Rehab from 01/01/2016 in [facility]     Date  09/09/15   Educator  LB   Instruction Review Code  2- meets goals/outcomes      Chronic Lung Diseases: - Group verbal and written instruction to review new updates, new respiratory medications, new advancements in procedures and treatments. Provide informative websites and "800" numbers of self-education.      Pulmonary Rehab from 01/01/2016 in [facility]   Date  12/30/15   Educator  L. Owens Shark, RT   Instruction Review Code  2- meets goals/outcomes      Lung Procedures: - Group verbal and written instruction to describe testing methods done to diagnose lung disease. Review the outcome of test results. Describe the treatment choices: Pulmonary Function Tests, ABGs and oximetry.   Energy Conservation: - Provide group verbal and written instruction for methods to conserve energy, plan and organize activities. Instruct on pacing techniques, use of adaptive equipment and posture/positioning to relieve shortness of breath.   Triggers: - Group verbal and written instruction to review types of environmental controls: home humidity, furnaces, filters, dust mite/pet prevention, HEPA vacuums. To discuss weather changes, air quality and the benefits of nasal washing.      Pulmonary Rehab from 01/01/2016  in [facility] Cardiac and Pulmonary Rehab   Date  09/09/15   Educator  LB   Instruction Review Code  2- meets goals/outcomes      Exacerbations: - Group verbal and written instruction to provide: warning signs, infection symptoms, calling MD promptly, preventive modes, and value of vaccinations. Review: effective airway clearance, coughing and/or vibration techniques. Create an Sports administrator.      Pulmonary Rehab from 01/01/2016 in [facility] Cardiac and Pulmonary Rehab   Date  11/04/15   Educator  LB   Instruction Review Code  2- meets goals/outcomes      Oxygen: - Individual and group verbal and written instruction on oxygen therapy. Includes supplement oxygen, available portable oxygen systems, continuous and intermittent flow rates, oxygen safety, concentrators, and Medicare reimbursement for oxygen.   Respiratory Medications: - Group verbal and written instruction to review medications for lung disease. Drug class, frequency, complications, importance of spacers, rinsing mouth after steroid MDI's, and proper cleaning methods for nebulizers.      Pulmonary Rehab from 01/01/2016 in [facility] Cardiac and Pulmonary Rehab   Date  08/20/15   Educator  LB   Instruction Review Code  2- meets goals/outcomes      AED/CPR: - Group verbal and written instruction with the use of models to demonstrate the basic use of the AED with the basic ABC's of resuscitation.      Pulmonary Rehab from 01/01/2016 in [facility] Cardiac and Pulmonary Rehab   Date  10/25/15   Educator  CE   Instruction Review Code  2- meets goals/outcomes      Breathing Retraining: - Provides individuals verbal and written instruction on purpose, frequency, and proper technique of diaphragmatic breathing and pursed-lipped breathing. Applies individual practice skills.      Pulmonary Rehab from 01/01/2016 in [facility] Cardiac and Pulmonary Rehab   Date  08/20/15   Educator  LB   Instruction Review Code  2- meets goals/outcomes      Anatomy and  Physiology of the Lungs: - Group verbal and written instruction with the use of models to provide basic lung anatomy and physiology related to function, structure and complications of lung disease.      Pulmonary Rehab from 01/01/2016 in [facility] Cardiac and Pulmonary Rehab   Date  11/15/15  Educator  SJ RRT   Instruction Review Code  2- meets goals/outcomes      Heart Failure: - Group verbal and written instruction on the basics of heart failure: signs/symptoms, treatments, explanation of ejection fraction, enlarged heart and cardiomyopathy.      Pulmonary Rehab from 01/01/2016 in [facility] Cardiac and Pulmonary Rehab   Date  12/20/15   Educator  SJ RRT   Instruction Review Code  2- meets goals/outcomes      Sleep Apnea: - Individual verbal and written instruction to review Obstructive Sleep Apnea. Review of risk factors, methods for diagnosing and types of masks and machines for OSA.   Anxiety: - Provides group, verbal and written instruction on the correlation between heart/lung disease and anxiety, treatment options, and management of anxiety.   Relaxation: - Provides group, verbal and written instruction about the benefits of relaxation for patients with heart/lung disease. Also provides patients with examples of relaxation techniques.      Pulmonary Rehab from 01/01/2016 in [facility] Cardiac and Pulmonary Rehab   Date  10/30/15   Educator  [facility]   Instruction Review Code  2- Meets goals/outcomes      Knowledge Questionnaire Score:     Knowledge Questionnaire Score - 08/20/15 0830    Knowledge Questionnaire Score   Pre Score -3       Core Components/Risk Factors/Patient Goals at Admission:     Personal Goals and Risk Factors at Admission - 08/20/15 0830    Core Components/Risk Factors/Patient Goals on Admission    Weight Management Yes   Intervention (read-only) Learn and follow the exercise and diet guidelines while in the program. Utilize the nutrition and education classes to  help gain knowledge of the diet and exercise expectations in the program  Mr Lastra would like to loss weight, but he is not sure he wants to meet with the dietitian. He has cut down on his portion sizes and has recently lost 8lbs.   Admit Weight 264 lb (119.75 kg)   Goal Weight: Short Term 200 lb (90.719 kg)   Sedentary Yes   Intervention (read-only) While in program, learn and follow the exercise prescription taught. Start at a low level workload and increase workload after able to maintain previous level for 30 minutes. Increase time before increasing intensity.  Mr Argyle would like to increase his exercise capacity. He has a home treadmill, stationary bike, and weights.   Improve shortness of breath with ADL's Yes   Intervention (read-only) While in program, learn and follow the exercise prescription taught. Start at a low level workload and increase workload ad advised by the exercise physiologist. Increase time before increasing intensity.  Mr Ngu goes at thing "all out" and does experience shortness of breath. He will benefit from supervised exercise, pacing , and PLB.   Develop more efficient breathing techniques such as purse lipped breathing and diaphragmatic breathing; and practicing self-pacing with activity Yes   Intervention (read-only) While in program, learn and utilize the specific breathing techniques taught to you. Continue to practice and use the techniques as needed.  Demonstrated good technique and understanding of PLBing   Increase knowledge of respiratory medications and ability to use respiratory devices properly  Yes   Intervention (read-only) While in program, learn to administer MDI, nebulizer, and spacer properly.;Learn to take respiratory medicine as ordered.;While in program, learn to Clean MDI, nebulizers, and spacers properly.  Mr Beal takes Spiriva, Symbicort, and Proventil MDI. Educated on these inhalers and gave Mr Heumann a  spacer. Mr Hestand is also on CPAP,  full mask, provided by the [place].   Diabetes Yes   Goal Blood glucose control identified by blood glucose values, HgbA1C. Participant verbalizes understanding of the signs/symptoms of hyper/hypo glycemia, proper foot care and importance of medication and nutrition plan for blood glucose control.   Intervention (read-only) Provide nutrition & aerobic exercise along with prescribed medications to achieve blood glucose in normal ranges: Fasting 65-99 mg/dL   Hypertension Yes   Goal Participant will see blood pressure controlled within the values of 140/40m/Hg or within value directed by their physician.   Intervention (read-only) Provide nutrition & aerobic exercise along with prescribed medications to achieve BP 140/90 or less.   Understand more about Heart/Pulmonary Disease. Yes   Intervention While in program utilize professionals for any questions, and attend the education sessions. Great websites to use are www.americanheart.org or www.lung.org for reliable information.  Ms Howald has Mild COPD, Asthma, and OSA and is very interested in learning new knowledge of these diseases and how to manage them.      Core Components/Risk Factors/Patient Goals Review:      Goals and Risk Factor Review      10/29/15 0851 10/30/15 1526 11/15/15 1604 11/29/15 1255 12/02/15 1105   Core Components/Risk Factors/Patient Goals Review   Personal Goals Review Sedentary  Weight Management/Obesity;Diabetes;Hypertension Develop more efficient breathing techniques such as purse lipped breathing and diaphragmatic breathing and practicing self-pacing with activity. Weight Management/Obesity;Diabetes;Hypertension   Review GJacobushas been working exceptionally hard during class. GJuandedioshas noticed an improvement in his lGastonjobs - more energy and less shortness of breath; plu he does use the PLB when changing lawn equipment with his gardening. He has been using the spacer with no difficulties with his MDIs. GWyathas noticed  improvement with his BS since starting the program   was getting readings of 150-1925mDL now seeing 115-14049mL. He stated he expects an improved HgbA1C. His weight has remained the same since starting.  HE had lost a significant amou of weight in the past year. HIs blood pressure readings are in good range. TEddy is eating smaller portions and doing his best not to snack too often.  I reviewed PLB with Mr. AllAyaladay while walking on the TM. Gleen reports his blood sugars have been around 120. He said he feels his clothes feel better even though the scale is flucutating. Glen's blood pressure has been very stable.    Expected Outcomes We should continue to see progression of workloads on all equipment in the next two weeks, pending regular attendance.  Weight will maintian or decrease as TedBarth Kirksrks on the nutrition and erercise guidelines woill in the program.  HIs BP and Blood sugar levels will continue to be in range using the same guidelines for weight control.  He performes proper technique and understans when to use it.  This will help him with his SOB while exercising.      12/11/15 1000 12/18/15 1605 12/20/15 1104 12/27/15 1435     Core Components/Risk Factors/Patient Goals Review   Personal Goals Review Increase Strength and Stamina Increase knowledge of respiratory medications and ability to use respiratory devices properly. Weight Management/Obesity;Diabetes;Hypertension Develop more efficient breathing techniques such as purse lipped breathing and diaphragmatic breathing and practicing self-pacing with activity.    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 Mr  Rosselli is very hesitate to use his Proventil MDI, because it makes him very shaky; although there are times when he is feeling tight in his chest and short of breath and would benefit from a rescue inhaler. I recommended  Xopenex , and he plans to call Dr Raul Del for a prescription. Raeqwon has good BP control,Diabetes numbers good control, has pulled belt in one notch.  268 pounds, was 285 a year ago. States portion control is hard to do, plans to start exercising at home with his grandson. Mr. Boniface is working very hard on the TM and was reminded to use purse lip breathing when giving out of breath.    Expected Outcomes Continue working on his exercise goals consistently at [employer] - continue with the interval training on the TM and T5. Prescribed Xopenex for a rescue inhaler which does not have the side effect that Proventil does with shakiness. Contniue with BP and Diabetes control. Wants to work on portion control, will see what he can do, to help with continued gradual weight loss.  Continue to exercise with less SOB.       Core Components/Risk Factors/Patient Goals at Discharge (Final Review):      Goals and Risk Factor Review - 12/27/15 1435    Core Components/Risk Factors/Patient Goals Review   Personal Goals Review Develop more efficient breathing techniques such as purse lipped breathing and diaphragmatic breathing and practicing self-pacing with activity.   Review Mr. Mcginnity is working very hard on the TM and was reminded to use purse lip breathing when giving out of breath.   Expected Outcomes Continue to exercise with less SOB.      ITP Comments:     ITP Comments      09/09/15 1110 10/14/15 1516 10/16/15 1051 10/21/15 1048 10/25/15 1101   ITP Comments Exercise and personal goals anticipated to be met in 20 more sessions. Called Mr Wernick and left him a message that his authorization has come in from the [place] , and he could start [facility] this week. Personal and exercise goals expected to be met in 34 more sessions. Progress on specific individualized goals will be charted in patient's ITP. Upon completion of the program the patient will be comfortable managing exercise goals and progression on their  own.  Personal and exercise goals expected to be met in 32 more sessiosns. See ITP for specific notes no goal progression. Personal and exercise goals expected to be met in 30 more sessions. See ITP for specific notes on goal progression     10/29/15 0828 10/30/15 1037 11/01/15 1024 12/02/15 1100 12/13/15 1141   ITP Comments SOB data, Quality of Life Data, and PHQ data entered on wrong patient - not Me Gearheart's date. Personal and exercise goals expected to be met in 29 more sessions. Progress on specific individualized goals will be charted in patient's ITP. Upon completion of the program the patient will be comfortable managing exercise goals and progression on their own.  Personal and exercise goals expected to be met in 27 more sessions. Progress on specific individualized goals will be charted in patient's ITP. Upon completion of the program the patient will be comfortable managing exercise goals and progression on their own.  Gleen reports his blood sugars have been around 120. He said he feels his clothes feel better even though the scale is flucutating. Glen's blood pressure has been very stable.  Belen left early since he wants to start planting a garden.  Comments: 30 Day Review    

## 2016-01-06 NOTE — Progress Notes (Signed)
Daily Session Note  Patient Details  Name: Adrian Harris. MRN: [id number] Date of Birth: [birth year] Referring Provider:    Encounter Date: 01/06/2016  Check In:     Session Check In - 01/06/16 1054    Check-In   Location [facility]-Cardiac & Pulmonary Rehab   Staff Present Carson Myrtle, BS, RRT, Respiratory Therapist;Carroll Enterkin, RN, Moises Blood, BS, ACSM CEP, Exercise Physiologist;Other   Supervising physician immediately available to respond to emergencies [facility] immediately available ER MD   Physician(s) Reita Cliche and Corky Downs   Medication changes reported     No   Fall or balance concerns reported    No   Warm-up and Cool-down Performed on first and last piece of equipment   Resistance Training Performed Yes   VAD Patient? No   Pain Assessment   Currently in Pain? No/denies   Multiple Pain Sites No         Goals Met:  Proper associated with RPD/PD & O2 Sat Independence with exercise equipment Exercise tolerated well Strength training completed today  Goals Unmet:  Not Applicable  Comments:    Dr. Emily Filbert is Medical Director for [facility] and [facility] Rehabilitation.

## 2016-01-07 NOTE — Progress Notes (Signed)
Discharge Summary  Patient Details  Name: Adrian Harris. MRN: [id number] Date of Birth: Aug 01, 1951 Referring Provider:  [facility]   Number of Visits: 37  Reason for Discharge:  Patient reached a stable level of exercise. Patient independent in their exercise.  Smoking History:  History  Smoking status   Former Smoker -- 2.00 packs/day for 9 years   Types: Cigarettes  Smokeless tobacco   Former Systems developer   Quit date: 11/11/1974    Diagnosis:  COPD, mild ([place]) - Plan: PULMONARY REHAB 30 DAY REVIEW  Asthma, mild intermittent, uncomplicated - Plan: PULMONARY REHAB 30 DAY REVIEW  ADL UCSD:     Pulmonary Assessment Scores      08/20/15 0830 09/09/15 1000 10/23/15 1000   ADL UCSD   ADL Phase Entry Exit Exit   SOB Score total 34 53 44   Rest 0 0 0   Walk 1 1 2    Stairs 2 4 4    Bath 0 2 0   Dress 0 2 1   Shop 2 2 2      10/29/15 0827 11/20/15 1000 01/07/16 1507   ADL UCSD   ADL Phase Mid Mid Exit   SOB Score total  27 14   Rest  0 0   Walk  2 1   Stairs  3 2   Bath  0 0   Dress  1 0   Shop  1 0      Initial Exercise Prescription:     Initial Exercise Prescription - 08/20/15 1000    Date of Initial Exercise RX and Referring Provider   Date 08/20/15   Treadmill   MPH 2.5   Grade 0   Minutes 10   Recumbant Bike   Level 2   RPM 40   Watts 20   Minutes 10   NuStep   Level 2   Watts 50   Minutes 10   Arm Ergometer   Level 1   Watts 10   Minutes 10   Recumbant Elliptical   Level 2   RPM 40   Watts 20   Minutes 10   Elliptical   Level 1   Speed 3   Minutes 1   REL-XR   Level 3   Watts 50   Minutes 10   T5 Nustep   Level 1   Watts 20   Minutes 10   Biostep-RELP   Level 3   Watts 50   Minutes 10   Prescription Details   Duration Progress to 30 minutes of continuous aerobic without signs/symptoms of physical distress   Intensity   THRR REST +  30   Ratings of Perceived Exertion 11-15   Perceived Dyspnea 2-4   Progression   Progression Continue progressive overload as per policy without signs/symptoms or physical distress.   Resistance Training   Training Prescription Yes   Weight 2   Reps 10-15      Discharge Exercise Prescription (Final Exercise Prescription Changes):     Exercise Prescription Changes - 01/07/16 1500    Exercise Review   Progression Yes   Response to Exercise   Blood Pressure (Admit) 138/74 mmHg  Data for 01/06/2016   Blood Pressure (Exercise) 158/70 mmHg   Blood Pressure (Exit) 128/78 mmHg   Heart Rate (Admit) 94 bpm   Heart Rate (Exercise) 138 bpm   Heart Rate (Exit) 102 bpm   Oxygen Saturation (Admit) 97 %   Oxygen Saturation (Exercise) 96 %  Oxygen Saturation (Exit) 98 %   Rating of Perceived Exertion (Exercise) 12   Perceived Dyspnea (Exercise) 3   Symptoms None   Intensity Rest + 30   Progression   Progression Continue to progress workloads to maintain intensity without signs/symptoms of physical distress.   Resistance Training   Training Prescription Yes   Weight 5   Reps 10-12   Interval Training   Interval Training Yes   Equipment Treadmill   Comments 2.5-3.3mph; 3-6% incline   Treadmill   MPH 3.5   Grade 6   Minutes 15   Elliptical   Level 14   Speed 4   Minutes 20   T5 Nustep   Level 8   Watts 90   Minutes 15   Home Exercise Plan   Plans to continue exercise at Home   Frequency --  Purchase a stationary bike, [employer], [employer]      Functional Capacity:     6 Minute Walk      08/20/15 1004 09/16/15 1022 10/23/15 0822   6 Minute Walk   Phase Initial Mid Program --   Distance 1700 feet 1000 feet --   Walk Time 6 minutes 6 minutes --   RPE 12 13 --   Perceived Dyspnea  3 3 --   Symptoms No No No   Resting HR 74 bpm 74 bpm --   Resting BP 118/74 mmHg 124/64 mmHg --   Max Ex. HR 117 bpm 82 bpm --   Max Ex. BP 144/76 mmHg 142/74 mmHg --     10/29/15 0825 11/22/15 1040     6 Minute Walk   Phase Mid Program Mid  Program    Distance --  Entered on wrong patient 1820 feet    Walk Time  6 minutes    # of Rest Breaks  0    RPE  12    Symptoms  No    Resting HR  88 bpm    Resting BP  128/88 mmHg    Max Ex. HR  108 bpm    Max Ex. BP  164/88 mmHg       Psychological, QOL, Others - Outcomes: PHQ 2/9: Depression screen [facility] 2/9 01/07/2016 10/23/2015 08/20/2015  Decreased Interest 0 0 0  Down, Depressed, Hopeless 0 0 0  PHQ - 2 Score 0 0 0  Altered sleeping 0 0 -  Tired, decreased energy 0 2 -  Change in appetite 0 1 -  Feeling bad or failure about yourself  0 1 -  Trouble concentrating 0 1 -  Moving slowly or fidgety/restless 0 0 -  Suicidal thoughts 0 0 -  PHQ-9 Score 0 5 -  Difficult doing work/chores Not difficult at all Somewhat difficult -    Quality of Life:     Quality of Life - 01/07/16 1508    Quality of Life Scores   Health/Function Pre 16.91 %   Health/Function Post 20.16 %   Health/Function % Change 19.22 %   Socioeconomic Pre 28.17 %   Socioeconomic Post 26.88 %   Socioeconomic % Change  -4.58 %   Psych/Spiritual Pre 26.79 %   Psych/Spiritual Post 26.43 %   Psych/Spiritual % Change -1.34 %   Family Pre 27.9 %   Family Post 21.5 %   Family % Change -22.94 %   GLOBAL Pre 22.54 %   GLOBAL Post 23.06 %   GLOBAL % Change 2.31 %      Personal  Goals: Goals established at orientation with interventions provided to work toward goal.     Personal Goals and Risk Factors at Admission - 08/20/15 0830    Core Components/Risk Factors/Patient Goals on Admission    Weight Management Yes   Intervention (read-only) Learn and follow the exercise and diet guidelines while in the program. Utilize the nutrition and education classes to help gain knowledge of the diet and exercise expectations in the program  Mr Dunaj would like to loss weight, but he is not sure he wants to meet with the dietitian. He has cut down on his portion sizes and has recently lost 8lbs.   Admit Weight 264 lb  (119.75 kg)   Goal Weight: Short Term 200 lb (90.719 kg)   Sedentary Yes   Intervention (read-only) While in program, learn and follow the exercise prescription taught. Start at a low level workload and increase workload after able to maintain previous level for 30 minutes. Increase time before increasing intensity.  Mr Wong would like to increase his exercise capacity. He has a home treadmill, stationary bike, and weights.   Improve shortness of breath with ADL's Yes   Intervention (read-only) While in program, learn and follow the exercise prescription taught. Start at a low level workload and increase workload ad advised by the exercise physiologist. Increase time before increasing intensity.  Mr Pooler goes at thing "all out" and does experience shortness of breath. He will benefit from supervised exercise, pacing , and PLB.   Develop more efficient breathing techniques such as purse lipped breathing and diaphragmatic breathing; and practicing self-pacing with activity Yes   Intervention (read-only) While in program, learn and utilize the specific breathing techniques taught to you. Continue to practice and use the techniques as needed.  Demonstrated good technique and understanding of PLBing   Increase knowledge of respiratory medications and ability to use respiratory devices properly  Yes   Intervention (read-only) While in program, learn to administer MDI, nebulizer, and spacer properly.;Learn to take respiratory medicine as ordered.;While in program, learn to Clean MDI, nebulizers, and spacers properly.  Mr Fritzler takes Spiriva, Symbicort, and Proventil MDI. Educated on these inhalers and gave Mr Merica a spacer. Mr Student is also on CPAP, full mask, provided by the [place].   Diabetes Yes   Goal Blood glucose control identified by blood glucose values, HgbA1C. Participant verbalizes understanding of the signs/symptoms of hyper/hypo glycemia, proper foot care and importance of medication and  nutrition plan for blood glucose control.   Intervention (read-only) Provide nutrition & aerobic exercise along with prescribed medications to achieve blood glucose in normal ranges: Fasting 65-99 mg/dL   Hypertension Yes   Goal Participant will see blood pressure controlled within the values of 140/13mm/Hg or within value directed by their physician.   Intervention (read-only) Provide nutrition & aerobic exercise along with prescribed medications to achieve BP 140/90 or less.   Understand more about Heart/Pulmonary Disease. Yes   Intervention While in program utilize professionals for any questions, and attend the education sessions. Great websites to use are www.americanheart.org or www.lung.org for reliable information.  Ms Poer has Mild COPD, Asthma, and OSA and is very interested in learning new knowledge of these diseases and how to manage them.       Personal Goals Discharge:     Goals and Risk Factor Review      10/29/15 0851 10/30/15 1526 11/15/15 1604 11/29/15 1255 12/02/15 1105   Core Components/Risk Factors/Patient Goals Review   Personal Goals Review  Sedentary  Weight Management/Obesity;Diabetes;Hypertension Develop more efficient breathing techniques such as purse lipped breathing and diaphragmatic breathing and practicing self-pacing with activity. Weight Management/Obesity;Diabetes;Hypertension   Review Aristeo has been working exceptionally hard during class. 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 He has been using the spacer with no difficulties with his MDIs. Dorris has noticed improvement with his BS since starting the program   was getting readings of 150-190mg /DL now seeing 115-140mg /DL. He stated he expects an improved HgbA1C. His weight has remained the same since starting.  HE had lost a significant amou of weight in the past year. HIs blood  pressure readings are in good range. TEddy is eating smaller portions and doing his best not to snack too often.  I reviewed PLB with Mr. Damery today while walking on the TM. Gleen reports his blood sugars have been around 120. He said he feels his clothes feel better even though the scale is flucutating. Glen's blood pressure has been very stable.    Expected Outcomes We should continue to see progression of workloads on all equipment in the next two weeks, pending regular attendance.  Weight will maintian or decrease as Barth Kirks works on the nutrition and erercise guidelines woill in the program.  HIs BP and Blood sugar levels will continue to be in range using the same guidelines for weight control.  He performes proper technique and understans when to use it.  This will help him with his SOB while exercising.      12/11/15 1000 12/18/15 1605 12/20/15 1104 12/27/15 1435 01/07/16 1510   Core Components/Risk Factors/Patient Goals Review   Personal Goals Review Increase Strength and Stamina Increase knowledge of respiratory medications and ability to use respiratory devices properly. Weight Management/Obesity;Diabetes;Hypertension Develop more efficient breathing techniques such as purse lipped breathing and diaphragmatic breathing and practicing self-pacing with activity. Sedentary;Increase Strength and Stamina;Develop more efficient breathing techniques such as purse lipped breathing and diaphragmatic breathing and practicing self-pacing with activity.;Improve shortness of breath with ADL's;Increase knowledge of respiratory medications and ability to use respiratory devices properly.   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 Mr Williamsen is very hesitate to use his Proventil MDI, because it makes him very shaky; although there are times when he is feeling tight in his chest and short of breath and  would benefit from a rescue inhaler. I recommended Xopenex , and he plans to call Dr Raul Del for a prescription. Jadaveon has good BP control,Diabetes numbers good control, has pulled belt in one notch.  268 pounds, was 285 a year ago. States portion control is hard to do, plans to start exercising at home with his grandson. Mr. Haberkorn is working very hard on the TM and was reminded to use purse lip breathing when giving out of breath. Mr Filardi is ready to graduate. He has improved  in his exercise goals, improved his 94mwd beyond the Minimal Importance Difference of 98.32ft forCOPD, improved his shortness of breath beyond the Minimal Important Difference of 5 points. He has a good understanding of his inhalers and PLB. He has noticed an improvemnet in his arm strength and stamina with activites at home. He plans to continue his exercise at home on is aerobic equipment and with his  eights.   Expected Outcomes Continue working on his exercise goals consistently at [employer] - continue with the interval training on the TM and T5. Prescribed Xopenex for a rescue inhaler which does not have the side effect that Proventil does with shakiness. Contniue with BP and Diabetes control. Wants to work on portion control, will see what he can do, to help with continued gradual weight loss.  Continue to exercise with less SOB. Continue exercising 5-6days/week and weights 3 days/week and continue managing his COPD daily.      Nutrition & Weight - Outcomes:      Post Biometrics - 08/20/15 1006     Post  Biometrics   Height 6\' 1"  (1.854 m)   Weight 264 lb (119.75 kg)   Waist Circumference 46 inches   Hip Circumference 48 inches   Waist to Hip Ratio 0.96 %   BMI (Calculated) 34.9      Nutrition:     Nutrition Therapy & Goals - 08/20/15 0830    Nutrition Therapy   Diet Mr Renner would like to loss weight, but he is not sure he wants to meet with the dietitian. He has cut down on his portion sizes and has recently  lost 8lbs.      Nutrition Discharge:   Education Questionnaire Score:     Knowledge Questionnaire Score - 08/20/15 0830    Knowledge Questionnaire Score   Pre Score -3      Goals reviewed with patient; copy given to patient.

## 2016-01-07 NOTE — Patient Instructions (Signed)
Discharge Instructions  Patient Details  Name: Adrian Harris. MRN: [id number] Date of Birth: 04-Jun-1951 Referring Provider:  [facility]   Number of Visits: 50  eason for Discharge:  Patient reached a stable level of exercise. Patient independent in their exercise.  Smoking History:  History  Smoking status   Former Smoker -- 2.00 packs/day for 9 years   Types: Cigarettes  Smokeless tobacco   Former Systems developer   Quit date: 11/11/1974    Diagnosis:  COPD, mild ([place]) - Plan: PULMONARY REHAB 30 DAY REVIEW  Asthma, mild intermittent, uncomplicated - Plan: PULMONARY REHAB 30 DAY REVIEW  Initial Exercise Prescription:     Initial Exercise Prescription - 08/20/15 1000    Date of Initial Exercise RX and Referring Provider   Date 08/20/15   Treadmill   MPH 2.5   Grade 0   Minutes 10   Recumbant Bike   Level 2   RPM 40   Watts 20   Minutes 10   NuStep   Level 2   Watts 50   Minutes 10   Arm Ergometer   Level 1   Watts 10   Minutes 10   Recumbant Elliptical   Level 2   RPM 40   Watts 20   Minutes 10   Elliptical   Level 1   Speed 3   Minutes 1   REL-XR   Level 3   Watts 50   Minutes 10   T5 Nustep   Level 1   Watts 20   Minutes 10   Biostep-RELP   Level 3   Watts 50   Minutes 10   Prescription Details   Duration Progress to 30 minutes of continuous aerobic without signs/symptoms of physical distress   Intensity   THRR REST +  30   Ratings of Perceived Exertion 11-15   Perceived Dyspnea 2-4   Progression   Progression Continue progressive overload as per policy without signs/symptoms or physical distress.   Resistance Training   Training Prescription Yes   Weight 2   Reps 10-15      Discharge Exercise Prescription (Final Exercise Prescription Changes):     Exercise Prescription Changes - 01/07/16 1500    Exercise Review   Progression Yes   Response to Exercise   Blood Pressure (Admit) 138/74 mmHg  Data for 01/06/2016    Blood Pressure (Exercise) 158/70 mmHg   Blood Pressure (Exit) 128/78 mmHg   Heart Rate (Admit) 94 bpm   Heart Rate (Exercise) 138 bpm   Heart Rate (Exit) 102 bpm   Oxygen Saturation (Admit) 97 %   Oxygen Saturation (Exercise) 96 %   Oxygen Saturation (Exit) 98 %   Rating of Perceived Exertion (Exercise) 12   Perceived Dyspnea (Exercise) 3   Symptoms None   Intensity Rest + 30   Progression   Progression Continue to progress workloads to maintain intensity without signs/symptoms of physical distress.   Resistance Training   Training Prescription Yes   Weight 5   Reps 10-12   Interval Training   Interval Training Yes   Equipment Treadmill   Comments 2.5-3.21mph; 3-6% incline   Treadmill   MPH 3.5   Grade 6   Minutes 15   Elliptical   Level 14   Speed 4   Minutes 20   T5 Nustep   Level 8   Watts 90   Minutes 15   Home Exercise Plan   Plans to continue exercise at [facility]  Frequency --  Purchase a stationary bike, [employer], EL      Functional Capacity:     6 Minute Walk      08/20/15 1004 09/16/15 1022 10/23/15 0822   6 Minute Walk   Phase Initial Mid Program --   Distance 1700 feet 1000 feet --   Walk Time 6 minutes 6 minutes --   RPE 12 13 --   Perceived Dyspnea  3 3 --   Symptoms No No No   Resting HR 74 bpm 74 bpm --   Resting BP 118/74 mmHg 124/64 mmHg --   Max Ex. HR 117 bpm 82 bpm --   Max Ex. BP 144/76 mmHg 142/74 mmHg --     10/29/15 0825 11/22/15 1040     6 Minute Walk   Phase Mid Program Mid Program    Distance --  Entered on wrong patient 1820 feet    Walk Time  6 minutes    # of Rest Breaks  0    RPE  12    Symptoms  No    Resting HR  88 bpm    Resting BP  128/88 mmHg    Max Ex. HR  108 bpm    Max Ex. BP  164/88 mmHg       Quality of Life:     Quality of Life - 01/07/16 1508    Quality of Life Scores   Health/Function Pre 16.91 %   Health/Function Post 20.16 %   Health/Function % Change 19.22 %   Socioeconomic Pre  28.17 %   Socioeconomic Post 26.88 %   Socioeconomic % Change  -4.58 %   Psych/Spiritual Pre 26.79 %   Psych/Spiritual Post 26.43 %   Psych/Spiritual % Change -1.34 %   Family Pre 27.9 %   Family Post 21.5 %   Family % Change -22.94 %   GLOBAL Pre 22.54 %   GLOBAL Post 23.06 %   GLOBAL % Change 2.31 %      Personal Goals: Goals established at orientation with interventions provided to work toward goal.     Personal Goals and Risk Factors at Admission - 08/20/15 0830    Core Components/Risk Factors/Patient Goals on Admission    Weight Management Yes   Intervention (read-only) Learn and follow the exercise and diet guidelines while in the program. Utilize the nutrition and education classes to help gain knowledge of the diet and exercise expectations in the program  Mr Marcussen would like to loss weight, but he is not sure he wants to meet with the dietitian. He has cut down on his portion sizes and has recently lost 8lbs.   Admit Weight 264 lb (119.75 kg)   Goal Weight: Short Term 200 lb (90.719 kg)   Sedentary Yes   Intervention (read-only) While in program, learn and follow the exercise prescription taught. Start at a low level workload and increase workload after able to maintain previous level for 30 minutes. Increase time before increasing intensity.  Mr Wherry would like to increase his exercise capacity. He has a home treadmill, stationary bike, and weights.   Improve shortness of breath with ADL's Yes   Intervention (read-only) While in program, learn and follow the exercise prescription taught. Start at a low level workload and increase workload ad advised by the exercise physiologist. Increase time before increasing intensity.  Mr Rosenbloom goes at thing "all out" and does experience shortness of breath. He will benefit from supervised exercise,  pacing , and PLB.   Develop more efficient breathing techniques such as purse lipped breathing and diaphragmatic breathing; and  practicing self-pacing with activity Yes   Intervention (read-only) While in program, learn and utilize the specific breathing techniques taught to you. Continue to practice and use the techniques as needed.  Demonstrated good technique and understanding of PLBing   Increase knowledge of respiratory medications and ability to use respiratory devices properly  Yes   Intervention (read-only) While in program, learn to administer MDI, nebulizer, and spacer properly.;Learn to take respiratory medicine as ordered.;While in program, learn to Clean MDI, nebulizers, and spacers properly.  Mr Howells takes Spiriva, Symbicort, and Proventil MDI. Educated on these inhalers and gave Mr Geoffroy a spacer. Mr Piasecki is also on CPAP, full mask, provided by the [place].   Diabetes Yes   Goal Blood glucose control identified by blood glucose values, HgbA1C. Participant verbalizes understanding of the signs/symptoms of hyper/hypo glycemia, proper foot care and importance of medication and nutrition plan for blood glucose control.   Intervention (read-only) Provide nutrition & aerobic exercise along with prescribed medications to achieve blood glucose in normal ranges: Fasting 65-99 mg/dL   Hypertension Yes   Goal Participant will see blood pressure controlled within the values of 140/94mm/Hg or within value directed by their physician.   Intervention (read-only) Provide nutrition & aerobic exercise along with prescribed medications to achieve BP 140/90 or less.   Understand more about Heart/Pulmonary Disease. Yes   Intervention While in program utilize professionals for any questions, and attend the education sessions. Great websites to use are www.americanheart.org or www.lung.org for reliable information.  Ms Apodaca has Mild COPD, Asthma, and OSA and is very interested in learning new knowledge of these diseases and how to manage them.       Personal Goals Discharge:     Goals and Risk Factor Review - 01/07/16 1510     Core Components/Risk Factors/Patient Goals Review   Personal Goals Review Sedentary;Increase Strength and Stamina;Develop more efficient breathing techniques such as purse lipped breathing and diaphragmatic breathing and practicing self-pacing with activity.;Improve shortness of breath with ADL's;Increase knowledge of respiratory medications and ability to use respiratory devices properly.   Review Mr Helmick is ready to graduate. He has improved  in his exercise goals, improved his 42mwd beyond the Minimal Importance Difference of 98.41ft forCOPD, improved his shortness of breath beyond the Minimal Important Difference of 5 points. He has a good understanding of his inhalers and PLB. He has noticed an improvemnet in his arm strength and stamina with activites at home. He plans to continue his exercise at home on is aerobic equipment and with his eights.   Expected Outcomes Continue exercising 5-6days/week and weights 3 days/week and continue managing his COPD daily.      Nutrition & Weight - Outcomes:      Post Biometrics - 08/20/15 1006     Post  Biometrics   Height 6\' 1"  (1.854 m)   Weight 264 lb (119.75 kg)   Waist Circumference 46 inches   Hip Circumference 48 inches   Waist to Hip Ratio 0.96 %   BMI (Calculated) 34.9      Nutrition:     Nutrition Therapy & Goals - 08/20/15 0830    Nutrition Therapy   Diet Mr Halt would like to loss weight, but he is not sure he wants to meet with the dietitian. He has cut down on his portion sizes and has recently lost 8lbs.  Nutrition Discharge:   Education Questionnaire Score:     Knowledge Questionnaire Score - 08/20/15 0830    Knowledge Questionnaire Score   Pre Score -3      Goals reviewed with patient; copy given to patient.

## 2016-01-08 DIAGNOSIS — J449 Chronic obstructive pulmonary disease, unspecified: Secondary | ICD-10-CM | POA: Diagnosis not present

## 2016-01-08 DIAGNOSIS — G4730 Sleep apnea, unspecified: Secondary | ICD-10-CM

## 2016-01-08 DIAGNOSIS — J4520 Mild intermittent asthma, uncomplicated: Secondary | ICD-10-CM

## 2016-01-08 NOTE — Progress Notes (Signed)
Daily Session Note  Patient Details  Name: Adrian Harris. MRN: [id number] Date of Birth: 07-Nov-1950 Referring Provider:    Encounter Date: 01/08/2016  Check In:     Session Check In - 01/08/16 1120    Check-In   Location [facility]   Staff Present Heath Lark, RN, BSN, CCRP;Laureen Owens Shark, BS, RRT, Respiratory Lennie Hummer, MA, ACSM RCEP, Exercise Physiologist;Yesly Gerety Oletta Darter, BA, ACSM CEP, Exercise Physiologist;Diane Joya Gaskins, RN, BSN   Supervising physician immediately available to respond to emergencies [facility] immediately available ER MD   Physician(s) Dr. Joni Fears and Clearnce Hasten   Medication changes reported     No   Fall or balance concerns reported    No   Warm-up and Cool-down Performed on first and last piece of equipment   Resistance Training Performed Yes   VAD Patient? No   Pain Assessment   Currently in Pain? No/denies           Exercise Prescription Changes - 01/07/16 1500    Exercise Review   Progression Yes   Response to Exercise   Blood Pressure (Admit) 138/74 mmHg  Data for 01/06/2016   Blood Pressure (Exercise) 158/70 mmHg   Blood Pressure (Exit) 128/78 mmHg   Heart Rate (Admit) 94 bpm   Heart Rate (Exercise) 138 bpm   Heart Rate (Exit) 102 bpm   Oxygen Saturation (Admit) 97 %   Oxygen Saturation (Exercise) 96 %   Oxygen Saturation (Exit) 98 %   Rating of Perceived Exertion (Exercise) 12   Perceived Dyspnea (Exercise) 3   Symptoms None   Intensity Rest + 30   Progression   Progression Continue to progress workloads to maintain intensity without signs/symptoms of physical distress.   Resistance Training   Training Prescription Yes   Weight 5   Reps 10-12   Interval Training   Interval Training Yes   Equipment Treadmill   Comments 2.5-3.74mh; 3-6% incline   Treadmill   MPH 3.5   Grade 6   Minutes 15   Elliptical   Level 14   Speed 4   Minutes 20   T5 Nustep   Level 8   Watts 90   Minutes 15   Home  Exercise Plan   Plans to continue exercise at Home   Frequency --  Purchase a stationary bike, [employer] and [employer]      Goals Met:  Proper associated with RPD/PD & O2 Sat Independence with exercise equipment Exercise tolerated well Strength training completed today  Goals Unmet:  Not Applicable  Comments: Doing well with exercise progression.    Dr. MEmily Filbertis Medical Director for [facility].

## 2016-01-08 NOTE — Progress Notes (Signed)
Pulmonary Individual Treatment Plan  Patient Details  Name: Adrian Harris. MRN: [id number] Date of Birth: August 15, 1951 Referring Provider: [facility]   Initial Encounter Date:       Pulmonary Rehab from 08/20/2015 in [place]   Date  08/20/15      Visit Diagnosis: COPD, mild ([place])  Asthma, mild intermittent, uncomplicated  Sleep apnea  Patient's Home Medications on Admission:  Current outpatient prescriptions:    albuterol (PROAIR HFA) 108 (90 BASE) MCG/ACT inhaler, Inhale into the lungs., Disp: , Rfl:    budesonide-formoterol (SYMBICORT) 160-4.5 MCG/ACT inhaler, Inhale into the lungs., Disp: , Rfl:    Cholecalciferol (VITAMIN D-1000 MAX ST) 1000 UNITS tablet, Take by mouth., Disp: , Rfl:    glucose blood (ACCU-CHEK COMFORT CURVE) test strip, , Disp: , Rfl:    lisinopril (PRINIVIL,ZESTRIL) 20 MG tablet, Take by mouth., Disp: , Rfl:    metFORMIN (GLUCOPHAGE-XR) 500 MG 24 hr tablet, Take by mouth., Disp: , Rfl:    mometasone (NASONEX) 50 MCG/ACT nasal spray, , Disp: , Rfl:    tiotropium (SPIRIVA) 18 MCG inhalation capsule, Place into inhaler and inhale., Disp: , Rfl:    vitamin B-12 (CYANOCOBALAMIN) 1000 MCG tablet, Take by mouth., Disp: , Rfl:   Past Medical History: No past medical history on file.  Tobacco Use: History  Smoking status   Former Smoker -- 2.00 packs/day for 9 years   Types: Cigarettes  Smokeless tobacco   Former Systems developer   Quit date: 11/11/1974    Labs: Recent Review Flowsheet Data    There is no flowsheet data to display.         POCT Glucose      10/16/15 1000 10/16/15 1145         POCT Blood Glucose   Pre-Exercise  129 mg/dL      Post-Exercise  121 mg/dL      Pre-Exercise #2 172 mg/dL       Post-Exercise #2 93 mg/dL          ADL UCSD:     Pulmonary Assessment Scores      08/20/15 0830 09/09/15 1000 10/23/15 1000   ADL UCSD   ADL Phase Entry Exit Exit   SOB Score  total 34 53 44   Rest 0 0 0   Walk '1 1 2   ' Stairs '2 4 4   ' Bath 0 2 0   Dress 0 2 1   Shop '2 2 2     ' 10/29/15 0827 11/20/15 1000 01/07/16 1507   ADL UCSD   ADL Phase Mid Mid Exit   SOB Score total  27 14   Rest  0 0   Walk  2 1   Stairs  3 2   Bath  0 0   Dress  1 0   Shop  1 0      Pulmonary Function Assessment:     Pulmonary Function Assessment - 08/20/15 0830    Pulmonary Function Tests   RV% 74 %   DLCO% 94 %   Initial Spirometry Results   FVC% 94 %   FEV1% 81 %   FEV1/FVC Ratio 68   Breath   Bilateral Breath Sounds Clear   Shortness of Breath Yes;Limiting activity      Exercise Target Goals:    Exercise Program Goal: Individual exercise prescription set with THRR, safety & activity barriers. Participant demonstrates ability to understand and report RPE using BORG scale,  to self-measure pulse accurately, and to acknowledge the importance of the exercise prescription.  Exercise Prescription Goal: Starting with aerobic activity 30 plus minutes a day, 3 days per week for initial exercise prescription. Provide home exercise prescription and guidelines that participant acknowledges understanding prior to discharge.  Activity Barriers & Risk Stratification:     Activity Barriers & Cardiac Risk Stratification - 08/20/15 0830    Activity Barriers & Cardiac Risk Stratification   Activity Barriers Shortness of Breath;Arthritis;Deconditioning;Muscular Weakness   Cardiac Risk Stratification Moderate      6 Minute Walk:     6 Minute Walk      08/20/15 1004 09/16/15 1022 10/23/15 0822   6 Minute Walk   Phase Initial Mid Program --   Distance 1700 feet 1000 feet --   Walk Time 6 minutes 6 minutes --   RPE 12 13 --   Perceived Dyspnea  3 3 --   Symptoms No No No   Resting HR 74 bpm 74 bpm --   Resting BP 118/74 mmHg 124/64 mmHg --   Max Ex. HR 117 bpm 82 bpm --   Max Ex. BP 144/76 mmHg 142/74 mmHg --     10/29/15 0825 11/22/15 1040     6 Minute Walk    Phase Mid Program Mid Program    Distance --  Entered on wrong patient 1820 feet    Walk Time  6 minutes    # of Rest Breaks  0    RPE  12    Symptoms  No    Resting HR  88 bpm    Resting BP  128/88 mmHg    Max Ex. HR  108 bpm    Max Ex. BP  164/88 mmHg       Initial Exercise Prescription:     Initial Exercise Prescription - 08/20/15 1000    Date of Initial Exercise RX and Referring Provider   Date 08/20/15   Treadmill   MPH 2.5   Grade 0   Minutes 10   Recumbant Bike   Level 2   RPM 40   Watts 20   Minutes 10   NuStep   Level 2   Watts 50   Minutes 10   Arm Ergometer   Level 1   Watts 10   Minutes 10   Recumbant Elliptical   Level 2   RPM 40   Watts 20   Minutes 10   Elliptical   Level 1   Speed 3   Minutes 1   REL-XR   Level 3   Watts 50   Minutes 10   T5 Nustep   Level 1   Watts 20   Minutes 10   Biostep-RELP   Level 3   Watts 50   Minutes 10   Prescription Details   Duration Progress to 30 minutes of continuous aerobic without signs/symptoms of physical distress   Intensity   THRR REST +  30   Ratings of Perceived Exertion 11-15   Perceived Dyspnea 2-4   Progression   Progression Continue progressive overload as per policy without signs/symptoms or physical distress.   Resistance Training   Training Prescription Yes   Weight 2   Reps 10-15      Perform Capillary Blood Glucose checks as needed.  Exercise Prescription Changes:     Exercise Prescription Changes      09/09/15 1500 10/16/15 1000 10/25/15 1100 10/28/15 0622 10/30/15 1000   Exercise Review  Progression   Yes  Yes   Response to Exercise   Blood Pressure (Admit) 112/80 mmHg   126/82 mmHg    Blood Pressure (Exercise) 140/82 mmHg   142/82 mmHg    Blood Pressure (Exit) 112/68 mmHg   118/72 mmHg    Heart Rate (Admit) 95 bpm   80 bpm    Heart Rate (Exercise) 136 bpm   110 bpm    Heart Rate (Exit) 116 bpm   104 bpm    Oxygen Saturation (Admit) 97 %   99 %    Oxygen  Saturation (Exercise) 99 %   98 %    Oxygen Saturation (Exit) 97 %   97 %    Rating of Perceived Exertion (Exercise) 13   12    Perceived Dyspnea (Exercise) 4   3    Symptoms  None None none none   Comments  Second day of exercise! Patient was re-oriented to the gym and the equipment functions and settings. Procedures and policies of the gym were outlined and explained. The patient's individual exercise prescription and treatment plan were reviewed with them. All starting workloads were established based on the results of the functional testing  done at the initial intake visit. The plan for exercise progression was also reviewed and progression will be customized based on the patient's performance and goals.  Second day of exercise! Patient was re-oriented to the gym and the equipment functions and settings. Procedures and policies of the gym were outlined and explained. The patient's individual exercise prescription and treatment plan were reviewed with them. All starting workloads were established based on the results of the functional testing  done at the initial intake visit. The plan for exercise progression was also reviewed and progression will be customized based on the patient's performance and goals.   Reviewed individualized exercise prescription and made increases per departmental policy. Exercise increases were discussed with the patient and they were able to perform the new work loads without issue (no signs or symptoms).    Duration  Progress to 30 minutes of continuous aerobic without signs/symptoms of physical distress Progress to 30 minutes of continuous aerobic without signs/symptoms of physical distress Progress to 50 minutes of aerobic without signs/symptoms of physical distress Progress to 50 minutes of aerobic without signs/symptoms of physical distress   Intensity  Rest + 30 Rest + 30 THRR unchanged THRR unchanged   Progression   Progression  Continue progressive overload as per  policy without signs/symptoms or physical distress. Continue progressive overload as per policy without signs/symptoms or physical distress. Continue progressive overload as per policy without signs/symptoms or physical distress. Continue progressive overload as per policy without signs/symptoms or physical distress.   Resistance Training   Training Prescription     Yes   Weight   4  4   Reps     10-12   Training Prescription (read-only) Yes Yes      Weight (read-only) 4 4      Reps (read-only) 10-12 10-12      Treadmill   MPH   3.5 3.5 3.5   Grade    1 1   Minutes    15 15   MPH (read-only) 3 3      Grade (read-only) 0 0      Minutes (read-only) 15 15      Recumbant Elliptical   Level (read-only)  5      RPM (read-only)  35      Minutes (read-only)  15      Elliptical   Level    2 2   Speed    3.8 3.8   Minutes   '3 7 12   ' REL-XR   Level (read-only) 2 2      Watts (read-only) 70 70      Minutes (read-only) 10 10      T5 Nustep   Level    2 5   Watts    70 55   Minutes    15 15   Level (read-only) 2 2      Watts (read-only) 40 40      Minutes (read-only) 15 15        11/01/15 1000 11/04/15 1000 11/08/15 1000 11/11/15 1000 11/15/15 1000   Exercise Review   Progression Yes Yes Yes Yes Yes   Response to Exercise   Symptoms none none none none none   Comments Increased elliptical time to accomodate increase in stamina and conditioning Increases in exercise prescription were reviewed with the patient. The increases were tolerated well with no signs or symptoms.  Increased ellliptical time to 16 minutes      Duration Progress to 50 minutes of aerobic without signs/symptoms of physical distress Progress to 50 minutes of aerobic without signs/symptoms of physical distress Progress to 50 minutes of aerobic without signs/symptoms of physical distress Progress to 50 minutes of aerobic without signs/symptoms of physical distress Progress to 50 minutes of aerobic without signs/symptoms of  physical distress   Intensity THRR unchanged THRR unchanged THRR unchanged THRR unchanged THRR unchanged   Progression   Progression Continue progressive overload as per policy without signs/symptoms or physical distress. Continue progressive overload as per policy without signs/symptoms or physical distress. Continue progressive overload as per policy without signs/symptoms or physical distress. Continue progressive overload as per policy without signs/symptoms or physical distress. Continue progressive overload as per policy without signs/symptoms or physical distress.   Resistance Training   Training Prescription Yes Yes Yes Yes Yes   Weight '4 4 4 4 4   ' Reps 10-12 10-12 10-12 10-12 10-12   Treadmill   MPH 3.5 3.5 3.5 3.5 3.5   Grade '1 1 1 1 3   ' Minutes '15 15 15 15 15   ' Recumbant Elliptical   Level '5 5 5 5 5   ' RPM 35 35 35 35 35   Watts '15 15 15 15 15   ' Elliptical   Level '2 2 2 5 7   ' Speed 3.8 3.8 3.'8 4 4   ' Minutes '15 15 16 17 17   ' REL-XR   Level    6 6   Watts    90 90   Minutes    10 10   T5 Nustep   Level '5 6 6 6 7   ' Watts 55 75 75 75 90   Minutes '15 15 15 15 15     ' 11/20/15 1000 12/09/15 1000 12/11/15 1000 12/23/15 1100 12/31/15 0700   Exercise Review   Progression Yes Yes Yes Yes Yes   Response to Exercise   Blood Pressure (Admit)  140/70 mmHg   138/68 mmHg  Data from 12/30/2015   Blood Pressure (Exercise)  140/80 mmHg      Blood Pressure (Exit)     110/72 mmHg   Heart Rate (Admit)  94 bpm   85 bpm   Heart Rate (Exercise)  147 bpm   135 bpm   Heart Rate (Exit)     92 bpm  Oxygen Saturation (Admit)  98 %   98 %   Oxygen Saturation (Exercise)  95 %   96 %   Oxygen Saturation (Exit)     95 %   Rating of Perceived Exertion (Exercise)  16  13-16 ranges depending on equipment --  13-16 ranges depending on equipment --  13-16 ranges depending on equipment 14  13-16 ranges depending on equipment   Perceived Dyspnea (Exercise)  5   3   Symptoms None None   None    Comments Reviewed individualized exercise prescription and made increases per departmental policy. Exercise increases were discussed with the patient and they were able to perform the new work loads without issue (no signs or symptoms).  Reviewed individualized exercise prescription and made increases per departmental policy. Exercise increases were discussed with the patient and they were able to perform the new work loads without issue (no signs or symptoms).       Duration Progress to 50 minutes of aerobic without signs/symptoms of physical distress Progress to 50 minutes of aerobic without signs/symptoms of physical distress Progress to 50 minutes of aerobic without signs/symptoms of physical distress Progress to 50 minutes of aerobic without signs/symptoms of physical distress    Intensity THRR unchanged THRR unchanged THRR unchanged THRR unchanged THRR unchanged   Progression   Progression Continue progressive overload as per policy without signs/symptoms or physical distress. Continue progressive overload as per policy without signs/symptoms or physical distress. Continue progressive overload as per policy without signs/symptoms or physical distress. Continue progressive overload as per policy without signs/symptoms or physical distress.    Resistance Training   Training Prescription Yes Yes Yes Yes Yes   Weight '4 4 4 4 5   ' Reps 10-12 10-12 10-12 10-12 10-12   Interval Training   Interval Training   Yes Yes Yes   Equipment   Treadmill Treadmill Treadmill   Comments   3.0-3.50mh 3.5-6% incline 3.0-3.536m 3.5-6% incline 3.0-3.31m78m3.5-6% incline   Treadmill   MPH 3.5 3.5 3.5 3.5 3.5   Grade '3 6 6 6 6   ' Minutes '15 15 15 15 15   ' NuStep   Level   5 5    Recumbant Elliptical   Level 5 --      RPM 35 --      Watts 15 --      Elliptical   Level '10 13 13 14 14   ' Speed '4 4 4 4 4   ' Minutes '17 18 18 18 20   ' REL-XR   Level 6 --      Watts 90 --      Minutes 10 --      T5 Nustep   Level '7 8  8 8 8   ' Watts 90 90 95 95 90   Minutes '15 15 15 15 15     ' 01/07/16 1500           Exercise Review   Progression Yes       Response to Exercise   Blood Pressure (Admit) 138/74 mmHg  Data for 01/06/2016       Blood Pressure (Exercise) 158/70 mmHg       Blood Pressure (Exit) 128/78 mmHg       Heart Rate (Admit) 94 bpm       Heart Rate (Exercise) 138 bpm       Heart Rate (Exit) 102 bpm       Oxygen Saturation (Admit) 97 %  Oxygen Saturation (Exercise) 96 %       Oxygen Saturation (Exit) 98 %       Rating of Perceived Exertion (Exercise) 12       Perceived Dyspnea (Exercise) 3       Symptoms None       Intensity Rest + 30       Progression   Progression Continue to progress workloads to maintain intensity without signs/symptoms of physical distress.       Resistance Training   Training Prescription Yes       Weight 5       Reps 10-12       Interval Training   Interval Training Yes       Equipment Treadmill       Comments 2.5-3.36mh; 3-6% incline       Treadmill   MPH 3.5       Grade 6       Minutes 15       Elliptical   Level 14       Speed 4       Minutes 20       T5 Nustep   Level 8       Watts 90       Minutes 15       Home Exercise Plan   Plans to continue exercise at Home       Frequency --  Purchase a stationary bike, Ski and [employer] EL          Exercise Comments:     Exercise Comments      11/11/15 1043 11/13/15 1000 12/09/15 1049 12/23/15 1151 12/27/15 1122   Exercise Comments Reviewed individualized exercise prescription and made increases per departmental policy. Exercise increases were discussed with the patient and they were able to perform the new work loads without issue (no signs or symptoms).  Mr Manukyan started interval training on the treadmill today with increases in the grade from 3-5%, but keeping the speed at 3.5. He tolerated the training well with RPE/PD scales at 12/3. Reviewed increases in intensity on EL machine with patient.  He tolerated these changes well with no signs or symptoms.  Reviewed individualized exercise prescription and made increases per departmental policy. Exercise increases were discussed with the patient and they were able to perform the new work loads without issue (no signs or symptoms).  No resistive work todat. HAd worked in yard yesterday and shoulders are sore.      Discharge Exercise Prescription (Final Exercise Prescription Changes):     Exercise Prescription Changes - 01/07/16 1500    Exercise Review   Progression Yes   Response to Exercise   Blood Pressure (Admit) 138/74 mmHg  Data for 01/06/2016   Blood Pressure (Exercise) 158/70 mmHg   Blood Pressure (Exit) 128/78 mmHg   Heart Rate (Admit) 94 bpm   Heart Rate (Exercise) 138 bpm   Heart Rate (Exit) 102 bpm   Oxygen Saturation (Admit) 97 %   Oxygen Saturation (Exercise) 96 %   Oxygen Saturation (Exit) 98 %   Rating of Perceived Exertion (Exercise) 12   Perceived Dyspnea (Exercise) 3   Symptoms None   Intensity Rest + 30   Progression   Progression Continue to progress workloads to maintain intensity without signs/symptoms of physical distress.   Resistance Training   Training Prescription Yes   Weight 5   Reps 10-12   Interval Training   Interval Training Yes   Equipment Treadmill  Comments 2.5-3.79mh; 3-6% incline   Treadmill   MPH 3.5   Grade 6   Minutes 15   Elliptical   Level 14   Speed 4   Minutes 20   T5 Nustep   Level 8   Watts 90   Minutes 15   Home Exercise Plan   Plans to continue exercise at Home   Frequency --  Purchase a stationary bike, Ski and [employer] EL       Nutrition:  Target Goals: Understanding of nutrition guidelines, daily intake of sodium <150110m cholesterol <20054mcalories 30% from fat and 7% or less from saturated fats, daily to have 5 or more servings of fruits and vegetables.  Biometrics:      Post Biometrics - 08/20/15 1006     Post  Biometrics   Height '6\' 1"'   (1.854 m)   Weight 264 lb (119.75 kg)   Waist Circumference 46 inches   Hip Circumference 48 inches   Waist to Hip Ratio 0.96 %   BMI (Calculated) 34.9      Nutrition Therapy Plan and Nutrition Goals:     Nutrition Therapy & Goals - 08/20/15 0830    Nutrition Therapy   Diet Mr Teston would like to loss weight, but he is not sure he wants to meet with the dietitian. He has cut down on his portion sizes and has recently lost 8lbs.      Nutrition Discharge: Rate Your Plate Scores:   Psychosocial: Target Goals: Acknowledge presence or absence of depression, maximize coping skills, provide positive support system. Participant is able to verbalize types and ability to use techniques and skills needed for reducing stress and depression.  Initial Review & Psychosocial Screening:     Initial Psych Review & Screening - 08/20/15 0830    Family Dynamics   Good Support System? Yes   Comments Mr AllBrunkhorsts good support from his wife and children.   Barriers   Psychosocial barriers to participate in program There are no identifiable barriers or psychosocial needs.;The patient should benefit from training in stress management and relaxation.   Screening Interventions   Interventions Encouraged to exercise      Quality of Life Scores:     Quality of Life - 01/07/16 1508    Quality of Life Scores   Health/Function Pre 16.91 %   Health/Function Post 20.16 %   Health/Function % Change 19.22 %   Socioeconomic Pre 28.17 %   Socioeconomic Post 26.88 %   Socioeconomic % Change  -4.58 %   Psych/Spiritual Pre 26.79 %   Psych/Spiritual Post 26.43 %   Psych/Spiritual % Change -1.34 %   Family Pre 27.9 %   Family Post 21.5 %   Family % Change -22.94 %   GLOBAL Pre 22.54 %   GLOBAL Post 23.06 %   GLOBAL % Change 2.31 %      PHQ-9:     Recent Review Flowsheet Data    [facility] 10/23/2015 08/20/2015   Decreased Interest 0 0 0   Down, Depressed, Hopeless 0 0  0   PHQ - 2 Score 0 0 0   Altered sleeping 0 0 -   Tired, decreased energy 0 2 -   Change in appetite 0 1 -   Feeling bad or failure about yourself  0 1 -   Trouble concentrating 0 1 -   Moving slowly or fidgety/restless 0 0 -   Suicidal thoughts 0 0 -  PHQ-9 Score 0 5 -   Difficult doing work/chores Not difficult at all Somewhat difficult -      Psychosocial Evaluation and Intervention:     Psychosocial Evaluation - 01/01/16 1033    Discharge Psychosocial Assessment & Intervention   Comments Counselor follow up with Ms. Saddler reporting he is doing so much better than he was when he came to pulmonary rehab (3) months ago.  He reports breathing better and being able to work in his yard more.  Mr. Quincy states he is sleeping well, his appetite is good and his mood is extremely positive.  He reports no stress in his life currently and he plans to continue working out consistently upon discharge from this program.  Mr. Gathright was commended for his hard work and the progress made.        Psychosocial Re-Evaluation:     Psychosocial Re-Evaluation      10/21/15 1202           Psychosocial Re-Evaluation   Comments Counselor follow up with Mr. Eller today.  He reports this program has been positive for him with benefits of increased stamina and strength.  He mentioned having some physiological symptoms of diarrhea the past two weeks off and on.  He stated if this did not improve he would see a Tax adviser.  Otherwise, his mood and his appetite, & sleep continue to be good.           Education: Education Goals: Education classes will be provided on a weekly basis, covering required topics. Participant will state understanding/return demonstration of topics presented.  Learning Barriers/Preferences:     Learning Barriers/Preferences - 08/20/15 0830    Learning Barriers/Preferences   Learning Barriers None   Learning Preferences Group Instruction;Individual  Instruction;Pictoral;Skilled Demonstration;Verbal Instruction;Video;Written Material      Education Topics: Initial Evaluation Education: - Verbal, written and demonstration of respiratory meds, RPE/PD scales, oximetry and breathing techniques. Instruction on use of nebulizers and MDIs: cleaning and proper use, rinsing mouth with steroid doses and importance of monitoring MDI activations.          Pulmonary Rehab from 01/01/2016 in [facility] Cardiac and Pulmonary Rehab   Date  08/20/15   Educator  LB   Instruction Review Code  2- meets goals/outcomes      General Nutrition Guidelines/Fats and Fiber: -Group instruction provided by verbal, written material, models and posters to present the general guidelines for heart healthy nutrition. Gives an explanation and review of dietary fats and fiber.      Pulmonary Rehab from 01/01/2016 in [facility] Cardiac and Pulmonary Rehab   Date  11/25/15   Educator  CR   Instruction Review Code  2- meets goals/outcomes      Controlling Sodium/Reading Food Labels: -Group verbal and written material supporting the discussion of sodium use in heart healthy nutrition. Review and explanation with models, verbal and written materials for utilization of the food label.      Pulmonary Rehab from 01/01/2016 in [facility] Cardiac and Pulmonary Rehab   Date  12/09/15   Educator  CR   Instruction Review Code  2- meets goals/outcomes      Exercise Physiology & Risk Factors: - Group verbal and written instruction with models to review the exercise physiology of the cardiovascular system and associated critical values. Details cardiovascular disease risk factors and the goals associated with each risk factor.      Pulmonary Rehab from 01/01/2016 in [facility] Cardiac and Pulmonary Rehab   Date  11/20/15   Educator  SW   Instruction Review Code  2- meets goals/outcomes      Aerobic Exercise & Resistance Training: - Gives group verbal and written discussion on the health impact of  inactivity. On the components of aerobic and resistive training programs and the benefits of this training and how to safely progress through these programs.   Flexibility, Balance, General Exercise Guidelines: - Provides group verbal and written instruction on the benefits of flexibility and balance training programs. Provides general exercise guidelines with specific guidelines to those with heart or lung disease. Demonstration and skill practice provided.      Pulmonary Rehab from 01/01/2016 in [facility] Cardiac and Pulmonary Rehab   Date  01/01/16   Educator  BS   Instruction Review Code  2- meets goals/outcomes      Stress Management: - Provides group verbal and written instruction about the health risks of elevated stress, cause of high stress, and healthy ways to reduce stress.      Pulmonary Rehab from 01/01/2016 in [facility] Cardiac and Pulmonary Rehab   Date  10/16/15   Educator  [facility]   Instruction Review Code  2- meets goals/outcomes      Depression: - Provides group verbal and written instruction on the correlation between heart/lung disease and depressed mood, treatment options, and the stigmas associated with seeking treatment.      Pulmonary Rehab from 01/01/2016 in [facility] Cardiac and Pulmonary Rehab   Date  11/27/15   Educator  Caffie Pinto   Instruction Review Code  2- meets goals/outcomes      Exercise & Equipment Safety: - Individual verbal instruction and demonstration of equipment use and safety with use of the equipment.      Pulmonary Rehab from 01/01/2016 in [facility] Cardiac and Pulmonary Rehab   Date  09/09/15   Educator  LB   Instruction Review Code  2- meets goals/outcomes      Infection Prevention: - Provides verbal and written material to individual with discussion of infection control including proper hand washing and proper equipment cleaning during exercise session.      Pulmonary Rehab from 01/01/2016 in [facility] Cardiac and Pulmonary Rehab   Date  08/20/15   Educator  LB    Instruction Review Code  2- meets goals/outcomes      Falls Prevention: - Provides verbal and written material to individual with discussion of falls prevention and safety.      Pulmonary Rehab from 01/01/2016 in [facility] Cardiac and Pulmonary Rehab   Date  08/20/15   Educator  LB   Instruction Review Code  2- meets goals/outcomes      Diabetes: - Individual verbal and written instruction to review signs/symptoms of diabetes, desired ranges of glucose level fasting, after meals and with exercise. Advice that pre and post exercise glucose checks will be done for 3 sessions at entry of program.      Pulmonary Rehab from 01/01/2016 in [facility] Cardiac and Pulmonary Rehab   Date  09/09/15   Educator  LB   Instruction Review Code  2- meets goals/outcomes      Chronic Lung Diseases: - Group verbal and written instruction to review new updates, new respiratory medications, new advancements in procedures and treatments. Provide informative websites and "800" numbers of self-education.      Pulmonary Rehab from 01/01/2016 in [facility] Cardiac and Pulmonary Rehab   Date  12/30/15   Educator  L. Owens Shark, RT   Instruction Review Code  2- meets goals/outcomes  Lung Procedures: - Group verbal and written instruction to describe testing methods done to diagnose lung disease. Review the outcome of test results. Describe the treatment choices: Pulmonary Function Tests, ABGs and oximetry.   Energy Conservation: - Provide group verbal and written instruction for methods to conserve energy, plan and organize activities. Instruct on pacing techniques, use of adaptive equipment and posture/positioning to relieve shortness of breath.   Triggers: - Group verbal and written instruction to review types of environmental controls: home humidity, furnaces, filters, dust mite/pet prevention, HEPA vacuums. To discuss weather changes, air quality and the benefits of nasal washing.      Pulmonary Rehab from 01/01/2016  in [facility] Cardiac and Pulmonary Rehab   Date  09/09/15   Educator  LB   Instruction Review Code  2- meets goals/outcomes      Exacerbations: - Group verbal and written instruction to provide: warning signs, infection symptoms, calling MD promptly, preventive modes, and value of vaccinations. Review: effective airway clearance, coughing and/or vibration techniques. Create an Sports administrator.      Pulmonary Rehab from 01/01/2016 in [facility] Cardiac and Pulmonary Rehab   Date  11/04/15   Educator  LB   Instruction Review Code  2- meets goals/outcomes      Oxygen: - Individual and group verbal and written instruction on oxygen therapy. Includes supplement oxygen, available portable oxygen systems, continuous and intermittent flow rates, oxygen safety, concentrators, and Medicare reimbursement for oxygen.   Respiratory Medications: - Group verbal and written instruction to review medications for lung disease. Drug class, frequency, complications, importance of spacers, rinsing mouth after steroid MDI's, and proper cleaning methods for nebulizers.      Pulmonary Rehab from 01/01/2016 in [facility] Cardiac and Pulmonary Rehab   Date  08/20/15   Educator  LB   Instruction Review Code  2- meets goals/outcomes      AED/CPR: - Group verbal and written instruction with the use of models to demonstrate the basic use of the AED with the basic ABC's of resuscitation.      Pulmonary Rehab from 01/01/2016 in [facility] Cardiac and Pulmonary Rehab   Date  10/25/15   Educator  CE   Instruction Review Code  2- meets goals/outcomes      Breathing Retraining: - Provides individuals verbal and written instruction on purpose, frequency, and proper technique of diaphragmatic breathing and pursed-lipped breathing. Applies individual practice skills.      Pulmonary Rehab from 01/01/2016 in [facility] Cardiac and Pulmonary Rehab   Date  08/20/15   Educator  LB   Instruction Review Code  2- meets goals/outcomes      Anatomy and  Physiology of the Lungs: - Group verbal and written instruction with the use of models to provide basic lung anatomy and physiology related to function, structure and complications of lung disease.      Pulmonary Rehab from 01/01/2016 in [facility] Cardiac and Pulmonary Rehab   Date  11/15/15   Educator  SJ RRT   Instruction Review Code  2- meets goals/outcomes      Heart Failure: - Group verbal and written instruction on the basics of heart failure: signs/symptoms, treatments, explanation of ejection fraction, enlarged heart and cardiomyopathy.      Pulmonary Rehab from 01/01/2016 in [facility] Cardiac and Pulmonary Rehab   Date  12/20/15   Educator  SJ RRT   Instruction Review Code  2- meets goals/outcomes      Sleep Apnea: - Individual verbal and written instruction to review Obstructive  Sleep Apnea. Review of risk factors, methods for diagnosing and types of masks and machines for OSA.   Anxiety: - Provides group, verbal and written instruction on the correlation between heart/lung disease and anxiety, treatment options, and management of anxiety.   Relaxation: - Provides group, verbal and written instruction about the benefits of relaxation for patients with heart/lung disease. Also provides patients with examples of relaxation techniques.      Pulmonary Rehab from 01/01/2016 in [facility] Cardiac and Pulmonary Rehab   Date  10/30/15   Educator  [facility]   Instruction Review Code  2- Meets goals/outcomes      Knowledge Questionnaire Score:     Knowledge Questionnaire Score - 08/20/15 0830    Knowledge Questionnaire Score   Pre Score -3       Core Components/Risk Factors/Patient Goals at Admission:     Personal Goals and Risk Factors at Admission - 08/20/15 0830    Core Components/Risk Factors/Patient Goals on Admission    Weight Management Yes   Intervention (read-only) Learn and follow the exercise and diet guidelines while in the program. Utilize the nutrition and education classes to  help gain knowledge of the diet and exercise expectations in the program  Mr Norcia would like to loss weight, but he is not sure he wants to meet with the dietitian. He has cut down on his portion sizes and has recently lost 8lbs.   Admit Weight 264 lb (119.75 kg)   Goal Weight: Short Term 200 lb (90.719 kg)   Sedentary Yes   Intervention (read-only) While in program, learn and follow the exercise prescription taught. Start at a low level workload and increase workload after able to maintain previous level for 30 minutes. Increase time before increasing intensity.  Mr Watts would like to increase his exercise capacity. He has a home treadmill, stationary bike, and weights.   Improve shortness of breath with ADL's Yes   Intervention (read-only) While in program, learn and follow the exercise prescription taught. Start at a low level workload and increase workload ad advised by the exercise physiologist. Increase time before increasing intensity.  Mr Fera goes at thing "all out" and does experience shortness of breath. He will benefit from supervised exercise, pacing , and PLB.   Develop more efficient breathing techniques such as purse lipped breathing and diaphragmatic breathing; and practicing self-pacing with activity Yes   Intervention (read-only) While in program, learn and utilize the specific breathing techniques taught to you. Continue to practice and use the techniques as needed.  Demonstrated good technique and understanding of PLBing   Increase knowledge of respiratory medications and ability to use respiratory devices properly  Yes   Intervention (read-only) While in program, learn to administer MDI, nebulizer, and spacer properly.;Learn to take respiratory medicine as ordered.;While in program, learn to Clean MDI, nebulizers, and spacers properly.  Mr Fadden takes Spiriva, Symbicort, and Proventil MDI. Educated on these inhalers and gave Mr Pettaway a spacer. Mr Mierzwa is also on CPAP,  full mask, provided by the [place].   Diabetes Yes   Goal Blood glucose control identified by blood glucose values, HgbA1C. Participant verbalizes understanding of the signs/symptoms of hyper/hypo glycemia, proper foot care and importance of medication and nutrition plan for blood glucose control.   Intervention (read-only) Provide nutrition & aerobic exercise along with prescribed medications to achieve blood glucose in normal ranges: Fasting 65-99 mg/dL   Hypertension Yes   Goal Participant will see blood pressure controlled within the values of  140/62m/Hg or within value directed by their physician.   Intervention (read-only) Provide nutrition & aerobic exercise along with prescribed medications to achieve BP 140/90 or less.   Understand more about Heart/Pulmonary Disease. Yes   Intervention While in program utilize professionals for any questions, and attend the education sessions. Great websites to use are www.americanheart.org or www.lung.org for reliable information.  Ms Towner has Mild COPD, Asthma, and OSA and is very interested in learning new knowledge of these diseases and how to manage them.      Core Components/Risk Factors/Patient Goals Review:      Goals and Risk Factor Review      10/29/15 0851 10/30/15 1526 11/15/15 1604 11/29/15 1255 12/02/15 1105   Core Components/Risk Factors/Patient Goals Review   Personal Goals Review Sedentary  Weight Management/Obesity;Diabetes;Hypertension Develop more efficient breathing techniques such as purse lipped breathing and diaphragmatic breathing and practicing self-pacing with activity. Weight Management/Obesity;Diabetes;Hypertension   Review GKentleyhas been working exceptionally hard during class. 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 He has been using the spacer with no difficulties with his MDIs. GBilleyhas noticed  improvement with his BS since starting the program   was getting readings of 150-1951mDL now seeing 115-14052mL. He stated he expects an improved HgbA1C. His weight has remained the same since starting.  HE had lost a significant amou of weight in the past year. HIs blood pressure readings are in good range. TEddy is eating smaller portions and doing his best not to snack too often.  I reviewed PLB with Mr. AllBlomquistday while walking on the TM. Gleen reports his blood sugars have been around 120. He said he feels his clothes feel better even though the scale is flucutating. Glen's blood pressure has been very stable.    Expected Outcomes We should continue to see progression of workloads on all equipment in the next two weeks, pending regular attendance.  Weight will maintian or decrease as TedBarth Kirksrks on the nutrition and erercise guidelines woill in the program.  HIs BP and Blood sugar levels will continue to be in range using the same guidelines for weight control.  He performes proper technique and understans when to use it.  This will help him with his SOB while exercising.      12/11/15 1000 12/18/15 1605 12/20/15 1104 12/27/15 1435 01/07/16 1510   Core Components/Risk Factors/Patient Goals Review   Personal Goals Review Increase Strength and Stamina Increase knowledge of respiratory medications and ability to use respiratory devices properly. Weight Management/Obesity;Diabetes;Hypertension Develop more efficient breathing techniques such as purse lipped breathing and diaphragmatic breathing and practicing self-pacing with activity. Sedentary;Increase Strength and Stamina;Develop more efficient breathing techniques such as purse lipped breathing and diaphragmatic breathing and practicing self-pacing with activity.;Improve shortness of breath with ADL's;Increase knowledge of respiratory medications and ability to use respiratory devices properly.   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 Mr Stebner is very hesitate to use his Proventil MDI, because it makes him very shaky; although there are times when he is feeling tight in his chest and short of breath and would benefit from a rescue inhaler. I recommended Xopenex , and he plans to call Dr FleRaul Delr a prescription. GleKalabs good BP  control,Diabetes numbers good control, has pulled belt in one notch.  268 pounds, was 285 a year ago. States portion control is hard to do, plans to start exercising at home with his grandson. Mr. Quirino is working very hard on the TM and was reminded to use purse lip breathing when giving out of breath. Mr Greenslade is ready to graduate. He has improved  in his exercise goals, improved his 31md beyond the Minimal Importance Difference of 98.417fforCOPD, improved his shortness of breath beyond the Minimal Important Difference of 5 points. He has a good understanding of his inhalers and PLB. He has noticed an improvemnet in his arm strength and stamina with activites at home. He plans to continue his exercise at home on is aerobic equipment and with his eights.   Expected Outcomes Continue working on his exercise goals consistently at [employer] continue with the interval training on the TM and T5. Prescribed Xopenex for a rescue inhaler which does not have the side effect that Proventil does with shakiness. Contniue with BP and Diabetes control. Wants to work on portion control, will see what he can do, to help with continued gradual weight loss.  Continue to exercise with less SOB. Continue exercising 5-6days/week and weights 3 days/week and continue managing his COPD daily.      Core Components/Risk Factors/Patient Goals at Discharge (Final Review):      Goals and Risk Factor Review - 01/07/16 1510    Core Components/Risk Factors/Patient Goals Review   Personal Goals Review Sedentary;Increase Strength and  Stamina;Develop more efficient breathing techniques such as purse lipped breathing and diaphragmatic breathing and practicing self-pacing with activity.;Improve shortness of breath with ADL's;Increase knowledge of respiratory medications and ability to use respiratory devices properly.   Review Mr AlWestmans ready to graduate. He has improved  in his exercise goals, improved his 23m23mbeyond the Minimal Importance Difference of 98.4ft48frCOPD, improved his shortness of breath beyond the Minimal Important Difference of 5 points. He has a good understanding of his inhalers and PLB. He has noticed an improvemnet in his arm strength and stamina with activites at home. He plans to continue his exercise at home on is aerobic equipment and with his eights.   Expected Outcomes Continue exercising 5-6days/week and weights 3 days/week and continue managing his COPD daily.      ITP Comments:     ITP Comments      09/09/15 1110 10/14/15 1516 10/16/15 1051 10/21/15 1048 10/25/15 1101   ITP Comments Exercise and personal goals anticipated to be met in 20 more sessions. Called Mr Varnell and left him a message that [facility] could start [facility] this week. Personal and exercise goals expected to be met in 34 more sessions. Progress on specific individualized goals will be charted in patient's ITP. Upon completion of the program the patient will be comfortable managing exercise goals and progression on their own.  Personal and exercise goals expected to be met in 32 more sessiosns. See ITP for specific notes no goal progression. Personal and exercise goals expected to be met in 30 more sessions. See ITP for specific notes on goal progression     10/29/15 0828 10/30/15 1037 11/01/15 1024 12/02/15 1100 12/13/15 1141   ITP Comments SOB data, Quality of Life Data, and PHQ data entered on wrong patient - not Me Marker's date. Personal and exercise goals expected to be met in 29 more  sessions. Progress on  specific individualized goals will be charted in patient's ITP. Upon completion of the program the patient will be comfortable managing exercise goals and progression on their own.  Personal and exercise goals expected to be met in 27 more sessions. Progress on specific individualized goals will be charted in patient's ITP. Upon completion of the program the patient will be comfortable managing exercise goals and progression on their own.  Gleen reports his blood sugars have been around 120. He said he feels his clothes feel better even though the scale is flucutating. Glen's blood pressure has been very stable.  Deshane left early since he wants to start planting a garden.      Comments: Mr Malson graduated from [employer] 01/08/2016 and will continue exercise at home.

## 2016-01-08 NOTE — Addendum Note (Signed)
Addended by: Karie Fetch on: 01/08/2016 03:20 PM   Modules accepted: Orders

## 2016-06-24 ENCOUNTER — Other Ambulatory Visit: Payer: Self-pay | Admitting: Family Medicine

## 2016-06-24 DIAGNOSIS — R310 Gross hematuria: Secondary | ICD-10-CM

## 2016-06-29 ENCOUNTER — Ambulatory Visit
Admission: RE | Admit: 2016-06-29 | Discharge: 2016-06-29 | Disposition: A | Discharge status: Home / Self Care | Payer: Medicare Other | Source: Ambulatory Visit | Attending: Family Medicine | Admitting: Family Medicine

## 2016-06-29 DIAGNOSIS — R310 Gross hematuria: Secondary | ICD-10-CM | POA: Diagnosis not present

## 2016-06-29 DIAGNOSIS — N281 Cyst of kidney, acquired: Secondary | ICD-10-CM | POA: Insufficient documentation

## 2016-07-13 ENCOUNTER — Ambulatory Visit (INDEPENDENT_AMBULATORY_CARE_PROVIDER_SITE_OTHER): Payer: Medicare Other | Admitting: Urology

## 2016-07-13 ENCOUNTER — Encounter: Payer: Self-pay | Admitting: Urology

## 2016-07-13 VITALS — BP 121/83 | HR 80 | Ht 73.0 in | Wt 266.1 lb

## 2016-07-13 DIAGNOSIS — R310 Gross hematuria: Secondary | ICD-10-CM

## 2016-07-13 LAB — URINALYSIS, COMPLETE
Bilirubin, UA: NEGATIVE
Glucose, UA: NEGATIVE
Ketones, UA: NEGATIVE
Leukocytes, UA: NEGATIVE
Nitrite, UA: NEGATIVE
Protein, UA: NEGATIVE
Specific Gravity, UA: 1.015 (ref 1.005–1.030)
Urobilinogen, Ur: 1 mg/dL (ref 0.2–1.0)
pH, UA: 5.5 (ref 5.0–7.5)

## 2016-07-13 LAB — MICROSCOPIC EXAMINATION: Bacteria, UA: NONE SEEN

## 2016-07-13 NOTE — Progress Notes (Signed)
07/13/2016 2:31 PM   Valarie Merino. 13-Dec-1950 [id number]  Referring provider: Sofie Hartigan, MD [address] [place], [place] [address]  Chief Complaint  Patient presents with  . New Patient (Initial Visit)    gross hematuria     HPI: Mr Adrian Harris is a 65yo seen today for evaluation of gross hematuria. 3 weeks ago he had an episode of gross painless hematuria. No hx of nephroloithiasis. He has mild LUTS with nocturia 1x on occasion. Creatinine 0.8. He is a retired Art therapist. No hx of UTI or prostate infections.  He previously saw Dr. Fredna Dow for hypogonadism. He had a recent PSA but he does not remember the value.   He had a renal US which showed a right renal cyst but no other obvious abnormalities.   PMH: Past Medical History:  Diagnosis Date  . Acid reflux   . Arthritis   . Asthma   . Glaucoma   . Hyperlipidemia   . Sleep apnea     Surgical History: Past Surgical History:  Procedure Laterality Date  . BACK SURGERY    . NECK SURGERY      Home Medications:    Medication List       Accurate as of 07/13/16  2:31 PM. Always use your most recent med list.          ACCU-CHEK COMFORT CURVE test strip Generic drug:  glucose blood   lisinopril 20 MG tablet Commonly known as:  PRINIVIL,ZESTRIL Take by mouth.   lovastatin 20 MG tablet Commonly known as:  MEVACOR Take 20 mg by mouth at bedtime.   metFORMIN 500 MG 24 hr tablet Commonly known as:  GLUCOPHAGE-XR Take by mouth.   NASONEX 50 MCG/ACT nasal spray Generic drug:  mometasone   PROAIR HFA 108 (90 Base) MCG/ACT inhaler Generic drug:  albuterol Inhale into the lungs.   SYMBICORT 160-4.5 MCG/ACT inhaler Generic drug:  budesonide-formoterol Inhale into the lungs.   tiotropium 18 MCG inhalation capsule Commonly known as:  Esmeralda into inhaler and inhale.   vitamin B-12 1000 MCG tablet Commonly known as:  CYANOCOBALAMIN Take by  mouth.   VITAMIN D-1000 MAX ST 1000 units tablet Generic drug:  Cholecalciferol Take by mouth.       Allergies: No Known Allergies  Family History: Family History  Problem Relation Age of Onset  . Hematuria Neg Hx   . Kidney cancer Neg Hx   . Prostate cancer Neg Hx     Social History:  reports that he has quit smoking. His smoking use included Cigarettes. He has a 80.00 pack-year smoking history. He quit smokeless tobacco use about 41 years ago. His alcohol and drug histories are not on file.  ROS: UROLOGY Frequent Urination?: No Hard to postpone urination?: No Burning/pain with urination?: No Get up at night to urinate?: Yes Leakage of urine?: No Urine stream starts and stops?: No Trouble starting stream?: No Do you have to strain to urinate?: No Blood in urine?: Yes Urinary tract infection?: No Sexually transmitted disease?: No Injury to kidneys or bladder?: No Painful intercourse?: No Weak stream?: No Erection problems?: No Penile pain?: No  Gastrointestinal Nausea?: No Vomiting?: No Indigestion/heartburn?: Yes Diarrhea?: No Constipation?: No  Constitutional Fever: No Night sweats?: No Weight loss?: No Fatigue?: No  Skin Skin rash/lesions?: No Itching?: No  Eyes Blurred vision?: No Double vision?: No  Ears/Nose/Throat Sore throat?: No Sinus problems?: No  Hematologic/Lymphatic Swollen glands?:  No Easy bruising?: No  Cardiovascular Leg swelling?: No Chest pain?: No  Respiratory Cough?: No Shortness of breath?: No  Endocrine Excessive thirst?: No  Musculoskeletal Back pain?: No Joint pain?: No  Neurological Headaches?: No Dizziness?: No  Psychologic Depression?: No Anxiety?: No  Physical Exam: BP 121/83   Pulse 80   Ht 6\' 1"  (1.854 m)   Wt 120.7 kg (266 lb 1.6 oz)   BMI 35.11 kg/m   Constitutional:  Alert and oriented, No acute distress. HEENT: [place] AT, moist mucus membranes.  Trachea midline, no  masses. Cardiovascular: No clubbing, cyanosis, or edema. Respiratory: Normal respiratory effort, no increased work of breathing. GI: Abdomen is soft, nontender, nondistended, no abdominal masses GU: No CVA tenderness. Uncircumcised phallus. No masses/lesiosn on penis/testes/scrotum. Prostate 30g smooth, no induration, no nodules Skin: No rashes, bruises or suspicious lesions. Lymph: No cervical or inguinal adenopathy. Neurologic: Grossly intact, no focal deficits, moving all 4 extremities. Psychiatric: Normal mood and affect.  Laboratory Data: No results found for: WBC, HGB, HCT, MCV, PLT  No results found for: CREATININE  No results found for: PSA  No results found for: TESTOSTERONE  No results found for: HGBA1C  Urinalysis No results found for: COLORURINE, APPEARANCEUR, LABSPEC, PHURINE, GLUCOSEU, HGBUR, BILIRUBINUR, KETONESUR, PROTEINUR, UROBILINOGEN, NITRITE, LEUKOCYTESUR  Pertinent Imaging: Renal US  Assessment & Plan:    1. Gross hematuria -BMP today -CT urogram -cystoscopy - Urinalysis, Complete   No Follow-up on file.  Nicolette Bang, MD  [facility] [address] [place], [place] [address] [phone]

## 2016-07-14 LAB — BASIC METABOLIC PANEL
BUN / CREAT RATIO: 15 (ref 10–24)
BUN: 15 mg/dL (ref 8–27)
CO2: 23 mmol/L (ref 18–29)
CREATININE: 1 mg/dL (ref 0.76–1.27)
Calcium: 9.4 mg/dL (ref 8.6–10.2)
Chloride: 98 mmol/L (ref 96–106)
GFR calc non Af Amer: 79 mL/min/{1.73_m2} (ref >59)
GFR, EST AFRICAN AMERICAN: 91 mL/min/{1.73_m2} (ref >59)
GLUCOSE: 137 mg/dL — AB (ref 65–99)
Potassium: 4.1 mmol/L (ref 3.5–5.2)
SODIUM: 141 mmol/L (ref 134–144)

## 2016-07-21 ENCOUNTER — Ambulatory Visit
Admission: RE | Admit: 2016-07-21 | Discharge: 2016-07-21 | Disposition: A | Discharge status: Home / Self Care | Payer: Medicare Other | Source: Ambulatory Visit | Attending: Urology | Admitting: Urology

## 2016-07-21 DIAGNOSIS — N201 Calculus of ureter: Secondary | ICD-10-CM | POA: Insufficient documentation

## 2016-07-21 DIAGNOSIS — N281 Cyst of kidney, acquired: Secondary | ICD-10-CM | POA: Insufficient documentation

## 2016-07-21 DIAGNOSIS — R310 Gross hematuria: Secondary | ICD-10-CM

## 2016-07-21 DIAGNOSIS — K760 Fatty (change of) liver, not elsewhere classified: Secondary | ICD-10-CM | POA: Insufficient documentation

## 2016-07-21 DIAGNOSIS — N200 Calculus of kidney: Secondary | ICD-10-CM | POA: Diagnosis not present

## 2016-07-21 HISTORY — DX: Essential (primary) hypertension: I10

## 2016-07-21 HISTORY — DX: Malignant (primary) neoplasm, unspecified: C80.1

## 2016-07-21 HISTORY — DX: Type 2 diabetes mellitus without complications: E11.9

## 2016-07-21 MED ORDER — IOPAMIDOL (ISOVUE-300) INJECTION 61%
150.0000 mL | Freq: Once | INTRAVENOUS | Status: AC | PRN
  Administered 2016-07-21: 125 mL via INTRAVENOUS

## 2016-08-11 ENCOUNTER — Other Ambulatory Visit: Payer: Medicare Other

## 2016-08-19 ENCOUNTER — Telehealth: Payer: Self-pay | Admitting: Radiology

## 2016-08-19 ENCOUNTER — Ambulatory Visit: Payer: Medicare Other | Admitting: Urology

## 2016-08-19 ENCOUNTER — Encounter: Payer: Self-pay | Admitting: Urology

## 2016-08-19 ENCOUNTER — Other Ambulatory Visit: Payer: Self-pay | Admitting: Radiology

## 2016-08-19 VITALS — BP 150/81 | HR 96 | Ht 73.0 in | Wt 266.0 lb

## 2016-08-19 DIAGNOSIS — R310 Gross hematuria: Secondary | ICD-10-CM | POA: Diagnosis not present

## 2016-08-19 DIAGNOSIS — N200 Calculus of kidney: Secondary | ICD-10-CM

## 2016-08-19 DIAGNOSIS — N201 Calculus of ureter: Secondary | ICD-10-CM

## 2016-08-19 LAB — URINALYSIS, COMPLETE
Bilirubin, UA: NEGATIVE
Glucose, UA: NEGATIVE
KETONES UA: NEGATIVE
Leukocytes, UA: NEGATIVE
NITRITE UA: NEGATIVE
PH UA: 5.5 (ref 5.0–7.5)
Protein, UA: NEGATIVE
RBC, UA: NEGATIVE
SPEC GRAV UA: 1.02 (ref 1.005–1.030)
Urobilinogen, Ur: 1 mg/dL (ref 0.2–1.0)

## 2016-08-19 LAB — MICROSCOPIC EXAMINATION
BACTERIA UA: NONE SEEN
RBC MICROSCOPIC, UA: NONE SEEN /HPF (ref 0–?)

## 2016-08-19 MED ORDER — LIDOCAINE HCL 2 % EX GEL
1.0000 "application " | Freq: Once | CUTANEOUS | Status: AC
  Administered 2016-08-19: 1 via URETHRAL

## 2016-08-19 MED ORDER — CIPROFLOXACIN HCL 500 MG PO TABS
500.0000 mg | ORAL_TABLET | Freq: Once | ORAL | Status: AC
  Administered 2016-08-19: 500 mg via ORAL

## 2016-08-19 NOTE — Telephone Encounter (Signed)
Notified pt of surgery scheduled with Dr Pilar Jarvis on 09/11/16, pre-admit testing appt on 1/9//18 @1 :00 & to call day prior to surgery for arrival time to SDS. Pt voices understanding.

## 2016-08-19 NOTE — Telephone Encounter (Signed)
Unable to [facility] d/t mailbox is full. Need to discuss upcoming surgery with Dr Pilar Jarvis.

## 2016-08-19 NOTE — Progress Notes (Signed)
   08/19/16  CC:  Chief Complaint  Patient presents with  . Cysto    HPI: Adrian Harris is a 65yo seen today for evaluation of gross hematuria. 3 weeks ago he had an episode of gross painless hematuria. No hx of nephroloithiasis. He has mild LUTS with nocturia 1x on occasion. Creatinine 0.8. He is a retired Art therapist. No hx of UTI or prostate infections.   CT Urogram shopwed distal left 5x7 mm ureteral calculus. He also has a 3 mm nonobstructing left mid pole stone.  Blood pressure (!) 150/81, pulse 96, height 6\' 1"  (1.854 m), weight 266 lb (120.7 kg). NED. A&Ox3.   No respiratory distress   Abd soft, NT, ND Normal phallus with bilateral descended testicles  Cystoscopy Procedure Note  Patient identification was confirmed, informed consent was obtained, and patient was prepped using Betadine solution.  Lidocaine jelly was administered per urethral meatus.    Preoperative abx where received prior to procedure.     Pre-Procedure: - Inspection reveals a normal caliber ureteral meatus.  Procedure: The flexible cystoscope was introduced without difficulty - No urethral strictures/lesions are present. - Enlarged prostate  - Normal bladder neck - Bilateral ureteral orifices identified - Bladder mucosa  reveals no ulcers, tumors, or lesions - No bladder stones - No trabeculation  Retroflexion shows small intravesical lobe   Post-Procedure: - Patient tolerated the procedure well  Assessment/ Plan:  1. Left ureteral stone/renal stone I discussed with the patient that his left ureteral stone has likely been there for some time as he is asymptomatic from its obstruction this time. I did share with him my concern that this can eventually cause him to lose his left renal unit if left untreated. We discussed management options. We discussed both ureteroscopy and lithotripsy. Due to the location the distal ureter I recommended left ureteroscopy is a better means to render him  potentially stone free with one surgery. He also has a small stone in his left renal pelvis. We discussed that we will try to remove the stone as well if is easily accessible the time of surgery. We discussed the risks, benefits, and indications of cystoscopy, ureteroscopy, laser lithotripsy, and left ureteral stent placement. He understands the risks include but are not limited to bleeding, infection, need for repeat procedures, iatrogenic injury among others. All questions were answered. The patient has elected to proceed.  2. Gross hematuria Negative work up except for above

## 2016-08-24 LAB — URINE CULTURE

## 2016-08-27 ENCOUNTER — Other Ambulatory Visit: Payer: Self-pay | Admitting: Urology

## 2016-08-27 ENCOUNTER — Telehealth: Payer: Self-pay | Admitting: Radiology

## 2016-08-27 MED ORDER — LEVOFLOXACIN 500 MG PO TABS: 500.0000 mg | ORAL_TABLET | Freq: Every day | ORAL | 0 refills | Status: DC

## 2016-08-27 NOTE — Telephone Encounter (Signed)
-----   Message from Nickie Retort, MD sent at 08/27/2016  1:30 PM EST ----- Patient needs to be on levaquin 500 mg daily for a week due to urine culture before surgery. Would like to repeat urine culture prior to surgery

## 2016-08-27 NOTE — Telephone Encounter (Signed)
Pt confirmed understanding of prescription sent to pharmacy & to RTC for repeat ucx.

## 2016-08-27 NOTE — Telephone Encounter (Signed)
LMOM notifying pt of prescription sent to [facility] & to RTC for repeat ucx on 09/07/16 @10 :15. Requested pt return call to confirm.

## 2016-09-01 ENCOUNTER — Encounter
Admission: RE | Admit: 2016-09-01 | Discharge: 2016-09-01 | Disposition: A | Discharge status: Home / Self Care | Payer: Medicare Other | Source: Ambulatory Visit | Attending: Urology | Admitting: Urology

## 2016-09-01 DIAGNOSIS — I10 Essential (primary) hypertension: Secondary | ICD-10-CM | POA: Insufficient documentation

## 2016-09-01 DIAGNOSIS — E119 Type 2 diabetes mellitus without complications: Secondary | ICD-10-CM | POA: Insufficient documentation

## 2016-09-01 HISTORY — DX: Nausea with vomiting, unspecified: R11.2

## 2016-09-01 HISTORY — DX: Other specified postprocedural states: Z98.890

## 2016-09-01 HISTORY — DX: Chronic obstructive pulmonary disease, unspecified: J44.9

## 2016-09-01 NOTE — Patient Instructions (Signed)
  Your procedure is scheduled on: Friday Jan. 19 , 2018. Report to Same Day Surgery. To find out your arrival time please call [phone] between 1PM - 3PM on Thursday Jan. 18, 2018.  Remember: Instructions that are not followed completely may result in serious medical risk, up to and including death, or upon the discretion of your surgeon and anesthesiologist your surgery may need to be rescheduled.    _x___ 1. Do not eat food or drink liquids after midnight. No gum chewing or hard candies.     ____ 2. No Alcohol for 24 hours before or after surgery.   ____ 3. Bring all medications with you on the day of surgery if instructed.    __x__ 4. Notify your doctor if there is any change in your medical condition     (cold, fever, infections).    _____ 5. No smoking 24 hours prior to surgery.     Do not wear jewelry, make-up, hairpins, clips or nail polish.  Do not wear lotions, powders, or perfumes.   Do not shave 48 hours prior to surgery. Men may shave face and neck.  Do not bring valuables to the hospital.    [facility] is not responsible for any belongings or valuables.               Contacts, dentures or bridgework may not be worn into surgery.  Leave your suitcase in the car. After surgery it may be brought to your room.  For patients admitted to the hospital, discharge time is determined by your treatment team.   Patients discharged the day of surgery will not be allowed to drive home.    Please read over the following fact sheets that you were given:   [facility] Preparing for Surgery  _x___ Take these medicines the morning of surgery with A SIP OF WATER:    1. lisinopril (PRINIVIL,ZESTRIL)  2. omeprazole (PRILOSEC)    ____ Fleet Enema (as directed)   ____ Use CHG Soap as directed on instruction sheet  _x___ Use inhalers on the day of surgery and bring to hospital day of surgery  _x___ Stop metformin 2 days prior to surgery, last dose on 09/09/16.    ____ Take 1/2  of usual insulin dose the night before surgery and none on the morning of surgery does not apply.  ____ Stop Coumadin/Plavix/aspirin on does not apply.  __x__ Stop Anti-inflammatories such as Advil, Aleve, Ibuprofen, Motrin, Naproxen, Naprosyn, Goodies powders or aspirin products. OK to take Tylenol.   _x___ Stop supplements: Omega-3 Fatty Acids (FISH OIL),vitamin B-12 (CYANOCOBALAMIN),  Pyridoxine HCl (B-6 PO) until after surgery.    _x___ Bring C-Pap to the hospital.

## 2016-09-01 NOTE — Pre-Procedure Instructions (Signed)
No H + P noted in Epic, pt reports he is going to Dr's office for follow up urine test prior to surgery.  Notified Amy at Dr. Ramon Dredge office of lack of H+P, she will let Dr. Su Grand know.

## 2016-09-03 ENCOUNTER — Other Ambulatory Visit: Payer: Self-pay

## 2016-09-03 DIAGNOSIS — N200 Calculus of kidney: Secondary | ICD-10-CM

## 2016-09-07 ENCOUNTER — Other Ambulatory Visit: Payer: Medicare Other

## 2016-09-07 DIAGNOSIS — N200 Calculus of kidney: Secondary | ICD-10-CM

## 2016-09-07 LAB — URINALYSIS, COMPLETE
Bilirubin, UA: NEGATIVE
Ketones, UA: NEGATIVE
Leukocytes, UA: NEGATIVE
NITRITE UA: NEGATIVE
Protein, UA: NEGATIVE
Specific Gravity, UA: 1.015 (ref 1.005–1.030)
Urobilinogen, Ur: 0.2 mg/dL (ref 0.2–1.0)
pH, UA: 5.5 (ref 5.0–7.5)

## 2016-09-07 LAB — MICROSCOPIC EXAMINATION
Bacteria, UA: NONE SEEN
Epithelial Cells (non renal): NONE SEEN /hpf (ref 0–10)
WBC UA: NONE SEEN /HPF (ref 0–?)

## 2016-09-11 ENCOUNTER — Ambulatory Visit
Admission: RE | Admit: 2016-09-11 | Discharge: 2016-09-11 | Disposition: A | Discharge status: Home / Self Care | Payer: Medicare Other | Source: Ambulatory Visit | Attending: Urology | Admitting: Urology

## 2016-09-11 ENCOUNTER — Ambulatory Visit: Payer: Medicare Other | Admitting: Anesthesiology

## 2016-09-11 ENCOUNTER — Encounter: Payer: Self-pay | Admitting: Emergency Medicine

## 2016-09-11 ENCOUNTER — Encounter
Admission: RE | Disposition: A | Discharge status: Home / Self Care | Payer: Self-pay | Source: Ambulatory Visit | Attending: Urology

## 2016-09-11 DIAGNOSIS — M1990 Unspecified osteoarthritis, unspecified site: Secondary | ICD-10-CM | POA: Diagnosis not present

## 2016-09-11 DIAGNOSIS — J449 Chronic obstructive pulmonary disease, unspecified: Secondary | ICD-10-CM | POA: Diagnosis not present

## 2016-09-11 DIAGNOSIS — I10 Essential (primary) hypertension: Secondary | ICD-10-CM | POA: Diagnosis not present

## 2016-09-11 DIAGNOSIS — K219 Gastro-esophageal reflux disease without esophagitis: Secondary | ICD-10-CM | POA: Diagnosis not present

## 2016-09-11 DIAGNOSIS — E785 Hyperlipidemia, unspecified: Secondary | ICD-10-CM | POA: Diagnosis not present

## 2016-09-11 DIAGNOSIS — N202 Calculus of kidney with calculus of ureter: Secondary | ICD-10-CM | POA: Diagnosis not present

## 2016-09-11 DIAGNOSIS — G4730 Sleep apnea, unspecified: Secondary | ICD-10-CM | POA: Insufficient documentation

## 2016-09-11 DIAGNOSIS — Z87891 Personal history of nicotine dependence: Secondary | ICD-10-CM | POA: Insufficient documentation

## 2016-09-11 DIAGNOSIS — E119 Type 2 diabetes mellitus without complications: Secondary | ICD-10-CM | POA: Diagnosis not present

## 2016-09-11 DIAGNOSIS — H409 Unspecified glaucoma: Secondary | ICD-10-CM | POA: Diagnosis not present

## 2016-09-11 DIAGNOSIS — R310 Gross hematuria: Secondary | ICD-10-CM | POA: Diagnosis not present

## 2016-09-11 DIAGNOSIS — N201 Calculus of ureter: Secondary | ICD-10-CM

## 2016-09-11 DIAGNOSIS — Z7984 Long term (current) use of oral hypoglycemic drugs: Secondary | ICD-10-CM | POA: Insufficient documentation

## 2016-09-11 DIAGNOSIS — Z85820 Personal history of malignant melanoma of skin: Secondary | ICD-10-CM | POA: Insufficient documentation

## 2016-09-11 HISTORY — PX: CYSTOSCOPY WITH STENT PLACEMENT: SHX5790

## 2016-09-11 HISTORY — PX: URETEROSCOPY WITH HOLMIUM LASER LITHOTRIPSY: SHX6645

## 2016-09-11 LAB — GLUCOSE, CAPILLARY
Glucose-Capillary: 140 mg/dL — ABNORMAL HIGH (ref 65–99)
Glucose-Capillary: 133 mg/dL — ABNORMAL HIGH (ref 65–99)

## 2016-09-11 SURGERY — URETEROSCOPY, WITH LITHOTRIPSY USING HOLMIUM LASER
Anesthesia: General | Laterality: Left

## 2016-09-11 MED ORDER — CEFAZOLIN SODIUM-DEXTROSE 2-4 GM/100ML-% IV SOLN
2.0000 g | INTRAVENOUS | Status: AC
  Administered 2016-09-11: 2 g via INTRAVENOUS

## 2016-09-11 MED ORDER — ACETAMINOPHEN 10 MG/ML IV SOLN
INTRAVENOUS | Status: AC
  Filled 2016-09-11: qty 100

## 2016-09-11 MED ORDER — ONDANSETRON HCL 4 MG/2ML IJ SOLN
INTRAMUSCULAR | Status: AC
Start: 2016-09-11 — End: 2016-09-11
  Filled 2016-09-11: qty 2

## 2016-09-11 MED ORDER — LIDOCAINE 2% (20 MG/ML) 5 ML SYRINGE
INTRAMUSCULAR | Status: AC
  Filled 2016-09-11: qty 5

## 2016-09-11 MED ORDER — PROPOFOL 10 MG/ML IV BOLUS
INTRAVENOUS | Status: AC
  Filled 2016-09-11: qty 20

## 2016-09-11 MED ORDER — FAMOTIDINE 20 MG PO TABS
ORAL_TABLET | ORAL | Status: AC
  Administered 2016-09-11: 20 mg via ORAL
  Filled 2016-09-11: qty 1

## 2016-09-11 MED ORDER — PHENYLEPHRINE HCL 10 MG/ML IJ SOLN
INTRAMUSCULAR | Status: DC | PRN
  Administered 2016-09-11: 80 ug via INTRAVENOUS

## 2016-09-11 MED ORDER — PROPOFOL 10 MG/ML IV BOLUS
INTRAVENOUS | Status: DC | PRN
  Administered 2016-09-11: 180 mg via INTRAVENOUS
  Administered 2016-09-11: 20 mg via INTRAVENOUS

## 2016-09-11 MED ORDER — FAMOTIDINE 20 MG PO TABS
20.0000 mg | ORAL_TABLET | Freq: Once | ORAL | Status: AC
Start: 2016-09-11 — End: 2016-09-11
  Administered 2016-09-11: 20 mg via ORAL

## 2016-09-11 MED ORDER — HYDROCODONE-ACETAMINOPHEN 5-325 MG PO TABS: 1.0000 | ORAL_TABLET | Freq: Once | ORAL | Status: DC

## 2016-09-11 MED ORDER — FENTANYL CITRATE (PF) 100 MCG/2ML IJ SOLN
INTRAMUSCULAR | Status: AC
  Filled 2016-09-11: qty 2

## 2016-09-11 MED ORDER — MIDAZOLAM HCL 2 MG/2ML IJ SOLN
INTRAMUSCULAR | Status: AC
  Filled 2016-09-11: qty 2

## 2016-09-11 MED ORDER — ONDANSETRON HCL 4 MG PO TABS: 4.0000 mg | ORAL_TABLET | Freq: Once | ORAL | Status: DC

## 2016-09-11 MED ORDER — LEVOFLOXACIN 500 MG PO TABS: 500.0000 mg | ORAL_TABLET | Freq: Every day | ORAL | 0 refills | Status: AC

## 2016-09-11 MED ORDER — FENTANYL CITRATE (PF) 100 MCG/2ML IJ SOLN: 25.0000 ug | INTRAMUSCULAR | Status: DC | PRN

## 2016-09-11 MED ORDER — GLYCOPYRROLATE 0.2 MG/ML IJ SOLN
INTRAMUSCULAR | Status: AC
  Filled 2016-09-11: qty 1

## 2016-09-11 MED ORDER — FENTANYL CITRATE (PF) 100 MCG/2ML IJ SOLN
INTRAMUSCULAR | Status: DC | PRN
  Administered 2016-09-11 (×4): 25 ug via INTRAVENOUS

## 2016-09-11 MED ORDER — ONDANSETRON HCL 4 MG/2ML IJ SOLN: 4.0000 mg | Freq: Once | INTRAMUSCULAR | Status: DC | PRN

## 2016-09-11 MED ORDER — OXYCODONE HCL 5 MG PO TABS
5.0000 mg | ORAL_TABLET | Freq: Once | ORAL | Status: AC
  Administered 2016-09-11: 5 mg via ORAL
  Filled 2016-09-11: qty 1

## 2016-09-11 MED ORDER — ONDANSETRON HCL 4 MG/2ML IJ SOLN
INTRAMUSCULAR | Status: DC | PRN
  Administered 2016-09-11: 4 mg via INTRAVENOUS

## 2016-09-11 MED ORDER — HYDROCODONE-ACETAMINOPHEN 5-325 MG PO TABS: 1.0000 | ORAL_TABLET | ORAL | 0 refills | Status: DC | PRN

## 2016-09-11 MED ORDER — DEXAMETHASONE SODIUM PHOSPHATE 10 MG/ML IJ SOLN
INTRAMUSCULAR | Status: DC | PRN
  Administered 2016-09-11: 5 mg via INTRAVENOUS

## 2016-09-11 MED ORDER — DEXAMETHASONE SODIUM PHOSPHATE 10 MG/ML IJ SOLN
INTRAMUSCULAR | Status: AC
  Filled 2016-09-11: qty 1

## 2016-09-11 MED ORDER — CEFAZOLIN SODIUM-DEXTROSE 2-4 GM/100ML-% IV SOLN
INTRAVENOUS | Status: AC
  Filled 2016-09-11: qty 100

## 2016-09-11 MED ORDER — OXYCODONE HCL 5 MG PO TABS
ORAL_TABLET | ORAL | Status: AC
  Filled 2016-09-11: qty 1

## 2016-09-11 MED ORDER — PHENYLEPHRINE 40 MCG/ML (10ML) SYRINGE FOR IV PUSH (FOR BLOOD PRESSURE SUPPORT)
PREFILLED_SYRINGE | INTRAVENOUS | Status: AC
  Filled 2016-09-11: qty 10

## 2016-09-11 MED ORDER — MIDAZOLAM HCL 2 MG/2ML IJ SOLN
INTRAMUSCULAR | Status: DC | PRN
  Administered 2016-09-11: 2 mg via INTRAVENOUS

## 2016-09-11 MED ORDER — ACETAMINOPHEN 10 MG/ML IV SOLN
INTRAVENOUS | Status: DC | PRN
  Administered 2016-09-11: 1000 mg via INTRAVENOUS

## 2016-09-11 MED ORDER — ONDANSETRON HCL 4 MG PO TABS
ORAL_TABLET | ORAL | Status: AC
  Administered 2016-09-11: 4 mg
  Filled 2016-09-11: qty 1

## 2016-09-11 MED ORDER — SODIUM CHLORIDE 0.9 % IV SOLN
INTRAVENOUS | Status: DC
  Administered 2016-09-11: 07:00:00 via INTRAVENOUS

## 2016-09-11 MED ORDER — HYDROCODONE-ACETAMINOPHEN 5-325 MG PO TABS
ORAL_TABLET | ORAL | Status: AC
  Filled 2016-09-11: qty 1

## 2016-09-11 MED ORDER — LIDOCAINE HCL (CARDIAC) 20 MG/ML IV SOLN
INTRAVENOUS | Status: DC | PRN
  Administered 2016-09-11: 100 mg via INTRAVENOUS

## 2016-09-11 SURGICAL SUPPLY — 29 items
BACTOSHIELD CHG 4% 4OZ (MISCELLANEOUS) ×2
BASKET ZERO TIP 1.9FR (BASKET) IMPLANT
CATH URETL 5X70 OPEN END (CATHETERS) ×3 IMPLANT
CNTNR SPEC 2.5X3XGRAD LEK (MISCELLANEOUS) ×1
CONT SPEC 4OZ STER OR WHT (MISCELLANEOUS) ×2
CONTAINER SPEC 2.5X3XGRAD LEK (MISCELLANEOUS) ×1 IMPLANT
FIBER LASER LITHO 273 (Laser) IMPLANT
GLOVE BIO SURGEON STRL SZ7 (GLOVE) ×3 IMPLANT
GLOVE BIO SURGEON STRL SZ7.5 (GLOVE) ×3 IMPLANT
GOWN STRL REUS W/ TWL LRG LVL4 (GOWN DISPOSABLE) ×1 IMPLANT
GOWN STRL REUS W/TWL LRG LVL4 (GOWN DISPOSABLE) ×2
GOWN STRL REUS W/TWL XL LVL3 (GOWN DISPOSABLE) ×3 IMPLANT
GUIDEWIRE SUPER STIFF (WIRE) IMPLANT
INTRODUCER DILATOR DOUBLE (INTRODUCER) ×3 IMPLANT
KIT RM TURNOVER CYSTO AR (KITS) ×3 IMPLANT
PACK CYSTO AR (MISCELLANEOUS) ×3 IMPLANT
SCRUB CHG 4% DYNA-HEX 4OZ (MISCELLANEOUS) ×1 IMPLANT
SENSORWIRE 0.038 NOT ANGLED (WIRE) ×3
SET CYSTO W/LG BORE CLAMP LF (SET/KITS/TRAYS/PACK) ×3 IMPLANT
SHEATH URETERAL 13/15X36 1L (SHEATH) IMPLANT
SOL .9 NS 3000ML IRR  AL (IV SOLUTION) ×2
SOL .9 NS 3000ML IRR UROMATIC (IV SOLUTION) ×1 IMPLANT
STENT URET 6FRX24 CONTOUR (STENTS) IMPLANT
STENT URET 6FRX26 CONTOUR (STENTS) IMPLANT
STENT URET 6FRX28 CONTOUR (STENTS) ×3 IMPLANT
SURGILUBE 2OZ TUBE FLIPTOP (MISCELLANEOUS) ×3 IMPLANT
SYRINGE IRR TOOMEY STRL 70CC (SYRINGE) ×3 IMPLANT
WATER STERILE IRR 1000ML POUR (IV SOLUTION) ×3 IMPLANT
WIRE SENSOR 0.038 NOT ANGLED (WIRE) ×1 IMPLANT

## 2016-09-11 NOTE — Anesthesia Procedure Notes (Signed)
Procedure Name: LMA Insertion Date/Time: 09/11/2016 7:49 AM Performed by: Hedda Slade Pre-anesthesia Checklist: Patient identified, Emergency Drugs available, Suction available and Patient being monitored Patient Re-evaluated:Patient Re-evaluated prior to inductionOxygen Delivery Method: Circle system utilized Preoxygenation: Pre-oxygenation with 100% oxygen Intubation Type: IV induction Ventilation: Mask ventilation without difficulty LMA: LMA inserted LMA Size: 4.5 Number of attempts: 1 Placement Confirmation: positive ETCO2 and breath sounds checked- equal and bilateral Tube secured with: Tape Dental Injury: Teeth and Oropharynx as per pre-operative assessment

## 2016-09-11 NOTE — Anesthesia Post-op Follow-up Note (Cosign Needed)
Anesthesia QCDR form completed.        

## 2016-09-11 NOTE — Op Note (Signed)
Date of procedure: 09/11/16  Preoperative diagnosis:  1. Left ureteral stone   Postoperative diagnosis:  1. Left ureteral stone   Procedure: 1. Cystoscopy 2. Attempted left ureteroscopy 3. Left retrograde pyelograms interpretation 4. Left ureteral stent placement 6 French by 28 cm  Surgeon: Baruch Gouty, MD  Anesthesia: General  Complications: None  Intraoperative findings: The stone was seen on fluoroscopy. The UVJ. I was unable to safely advance the semirigid ureteroscope in the ureter so a stent was placed to temporize the patient as well as provide passive dilation for future ureteroscopy. Left retrograde pyelogram was obtained to identify the left collecting system.  EBL: None  Specimens: None  Drains: Left 6 French by 28 cm double-J ureteral stent  Disposition: Stable to the postanesthesia care unit  Indication for procedure: The patient is a 66 y.o. male with 7 mm distal left UVJ stone that was found a gross hematuria workup presents today for surgical management.  After reviewing the management options for treatment, the patient elected to proceed with the above surgical procedure(s). We have discussed the potential benefits and risks of the procedure, side effects of the proposed treatment, the likelihood of the patient achieving the goals of the procedure, and any potential problems that might occur during the procedure or recuperation. Informed consent has been obtained.  Description of procedure: The patient was met in the preoperative area. All risks, benefits, and indications of the procedure were described in great detail. The patient consented to the procedure. Preoperative antibiotics were given. The patient was taken to the operative theater. General anesthesia was induced per the anesthesia service. The patient was then placed in the dorsal lithotomy position and prepped and draped in the usual sterile fashion. A preoperative timeout was called.   A 21 French 30  cystoscope was inserted into the patient's urethra atraumatically. The left ear orifice was identified a sensor wire was placed the level of the renal pelvis under fluoroscopy. The cystoscope was exchanged for semirigid ureteroscope. Attempt was made to enter the left ureter however that his UVJ was very narrow. A second sensor wire was then used to try to help guide the scope into the ureter however this was still impossible and I started to notice some signs of trauma. At this point ureteroscopy was aborted. The stone was seen on fluoroscopy right at the UVJ so this was likely obstructing the entry into the left ureter. The ureteroscope was withdrawn. With the aid of a dual lumen catheter retrograde polygrams on the left was obtained to identify the collecting system. The cystoscope was assembled over the remaining sensor wire. A 6 French by 28 cm double-J ureteral stent was then placed the level of the renal pelvis and the sensor wire was removed. A curl was seen in the renal pelvis on the left with fluoroscopy in the urinary bladder with direct visualization. The patient's bladder was then drained. She was woken from anesthesia and transferred in stable condition to the post anesthesia care unit.  Plan: The patient will follow-up in a few weeks after passive dilation of his left ureter by the stent for repeat ureteroscopy.  Baruch Gouty, M.D.

## 2016-09-11 NOTE — Discharge Instructions (Signed)

## 2016-09-11 NOTE — Anesthesia Postprocedure Evaluation (Signed)
Anesthesia Post Note  Patient: Adrian Harris.  Procedure(s) Performed: Procedure(s) (LRB): URETEROSCOPY WITH HOLMIUM LASER LITHOTRIPSY (Left) CYSTOSCOPY WITH STENT PLACEMENT (Left)  Patient location during evaluation: PACU Anesthesia Type: General Level of consciousness: awake and alert Pain management: pain level controlled Vital Signs Assessment: post-procedure vital signs reviewed and stable Respiratory status: spontaneous breathing, nonlabored ventilation, respiratory function stable and patient connected to nasal cannula oxygen Cardiovascular status: blood pressure returned to baseline and stable Postop Assessment: no signs of nausea or vomiting Anesthetic complications: no     Last Vitals:  Vitals:   09/11/16 0943 09/11/16 1135  BP: 126/82 122/72  Pulse: 74 75  Resp: 16 16  Temp:  36.4 C    Last Pain:  Vitals:   09/11/16 1203  TempSrc:   PainSc: [place]

## 2016-09-11 NOTE — OR Nursing (Signed)
Pt c/o pressure like he needs to have a BM, pt spent 15 + minutes in bathroom having a BM.  Walked back to room and took a 30 min. Nap.

## 2016-09-11 NOTE — Anesthesia Preprocedure Evaluation (Addendum)
Anesthesia Evaluation  Patient identified by MRN, date of birth, ID band Patient awake    Reviewed: Allergy & Precautions, NPO status , Patient's Chart, lab work & pertinent test results, reviewed documented beta blocker date and time   History of Anesthesia Complications (+) PONV  Airway Mallampati: III  TM Distance: >3 FB     Dental  (+) Upper Dentures, Lower Dentures   Pulmonary asthma , sleep apnea , COPD, former smoker,           Cardiovascular hypertension, Pt. on medications      Neuro/Psych    GI/Hepatic GERD  Controlled,  Endo/Other  diabetes, Type 2  Renal/GU      Musculoskeletal  (+) Arthritis ,   Abdominal   Peds  Hematology   Anesthesia Other Findings Cervical fusion, neck movement ok. No malignant hyperthermia in him or relatives. No CPAP.  Reproductive/Obstetrics                            Anesthesia Physical Anesthesia Plan  ASA: III  Anesthesia Plan: General   Post-op Pain Management:    Induction: Intravenous  Airway Management Planned: Oral ETT and LMA  Additional Equipment:   Intra-op Plan:   Post-operative Plan:   Informed Consent: I have reviewed the patients History and Physical, chart, labs and discussed the procedure including the risks, benefits and alternatives for the proposed anesthesia with the patient or authorized representative who has indicated his/her understanding and acceptance.     Plan Discussed with: CRNA  Anesthesia Plan Comments:         Anesthesia Quick Evaluation

## 2016-09-11 NOTE — Transfer of Care (Signed)
Immediate Anesthesia Transfer of Care Note  Patient: Adrian Harris.  Procedure(s) Performed: Procedure(s): URETEROSCOPY WITH HOLMIUM LASER LITHOTRIPSY (Left) CYSTOSCOPY WITH STENT PLACEMENT (Left)  Patient Location: PACU  Anesthesia Type:General  Level of Consciousness: sedated  Airway & Oxygen Therapy: Patient Spontanous Breathing and Patient connected to face mask oxygen  Post-op Assessment: Report given to RN and Post -op Vital signs reviewed and stable  Post vital signs: Reviewed and stable  Last Vitals:  Vitals:   09/11/16 0638 09/11/16 0819  BP: (!) 189/92 100/67  Pulse: 82 73  Resp: 14 11  Temp: 36.1 C (!) 36 C    Last Pain:  Vitals:   09/11/16 0819  TempSrc: Temporal  PainSc: Asleep         Complications: No apparent anesthesia complications

## 2016-09-11 NOTE — H&P (Signed)
@ENCDATE @ 7:26 AM   Adrian Harris. 07-Aug-1951 [id number]  CC: Nephrolithiasis  HPI: Adrian Harris is a 66yo seen today for evaluation of gross hematuria. 3 weeks ago he had an episode of gross painless hematuria. No hx of nephroloithiasis. He has mild LUTS with nocturia 1x on occasion. Creatinine 0.8. He is a retired Art therapist. No hx of UTI or prostate infections.   CT Urogram shopwed distal left 5x7 mm ureteral calculus. He also has a 3 mm nonobstructing left mid pole stone.     PMH: Past Medical History:  Diagnosis Date  . Acid reflux   . Arthritis   . Asthma   . Cancer ([place])    melanoma  back  . COPD (chronic obstructive pulmonary disease) ([place])   . Diabetes mellitus without complication ([place])   . Glaucoma   . Hyperlipidemia   . Hypertension   . Malignant hyperthermia   . PONV (postoperative nausea and vomiting)   . Sleep apnea     Surgical History: Past Surgical History:  Procedure Laterality Date  . APPENDECTOMY  1962  . BACK SURGERY    . 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 EXCISION MORTON'S NEUROMA Left 2008  . KNEE ARTHROSCOPY Left 1998  . NECK SURGERY      Home Medications:    Allergies: No Known Allergies  Family History: Family History  Problem Relation Age of Onset  . Hematuria Neg Hx   . Kidney cancer Neg Hx   . Prostate cancer Neg Hx     Social History:  reports that he quit smoking about 41 years ago. His smoking use included Cigarettes. He has a 80.00 pack-year smoking history. He has never used smokeless tobacco. He reports that he does not drink alcohol or use drugs.  ROS: 12 point ROS negative except for above                                        Physical Exam: BP (!) 189/92   Pulse 82   Temp 97 F (36.1 C) (Temporal)   Resp 14   SpO2 98%   Constitutional:  Alert and oriented, No acute distress. HEENT: [place] AT, moist mucus membranes.  Trachea midline, no  masses. Cardiovascular: No clubbing, cyanosis, or edema. RRR Respiratory: Normal respiratory effort, no increased work of breathing. Unlabored resp GI: Abdomen is soft, nontender, nondistended, no abdominal masses GU: No CVA tenderness.  Skin: No rashes, bruises or suspicious lesions. Lymph: No cervical or inguinal adenopathy. Neurologic: Grossly intact, no focal deficits, moving all 4 extremities. Psychiatric: Normal mood and affect.  Laboratory Data: No results found for: WBC, HGB, HCT, MCV, PLT  Lab Results  Component Value Date   CREATININE 1.00 07/13/2016    No results found for: PSA  No results found for: TESTOSTERONE  No results found for: HGBA1C  Urinalysis    Component Value Date/Time   APPEARANCEUR Hazy (A) 09/07/2016 1009   GLUCOSEU 3+ (A) 09/07/2016 1009   BILIRUBINUR Negative 09/07/2016 1009   PROTEINUR Negative 09/07/2016 1009   NITRITE Negative 09/07/2016 1009   LEUKOCYTESUR Negative 09/07/2016 1009   Assessment & Plan:    1. Left ureteral stone/renal stone I discussed with the patient that his left ureteral stone has likely been there for some time as he is asymptomatic from its obstruction this time. I  did share with him my concern that this can eventually cause him to lose his left renal unit if left untreated. We discussed management options. We discussed both ureteroscopy and lithotripsy. Due to the location the distal ureter I recommended left ureteroscopy is a better means to render him potentially stone free with one surgery. He also has a small stone in his left renal pelvis. We discussed that we will try to remove the stone as well if is easily accessible the time of surgery. We discussed the risks, benefits, and indications of cystoscopy, ureteroscopy, laser lithotripsy, and left ureteral stent placement. He understands the risks include but are not limited to bleeding, infection, need for repeat procedures, iatrogenic injury among others. All questions  were answered. The patient has elected to proceed.  2. Gross hematuria Negative work up except for above  Nickie Retort, [place] [address] [place], [place] [address] [phone]

## 2016-09-14 ENCOUNTER — Other Ambulatory Visit: Payer: Self-pay | Admitting: Radiology

## 2016-09-14 LAB — CULTURE, URINE COMPREHENSIVE

## 2016-09-16 ENCOUNTER — Other Ambulatory Visit: Payer: Self-pay | Admitting: Radiology

## 2016-09-16 DIAGNOSIS — N201 Calculus of ureter: Secondary | ICD-10-CM

## 2016-09-21 ENCOUNTER — Other Ambulatory Visit: Payer: Medicare Other

## 2016-09-21 DIAGNOSIS — N201 Calculus of ureter: Secondary | ICD-10-CM

## 2016-09-22 ENCOUNTER — Telehealth: Payer: Self-pay | Admitting: Radiology

## 2016-09-22 NOTE — Telephone Encounter (Signed)
Notified pt of surgery with Dr Erlene Quan on 09/28/16 & to arrive at 7:00 to pre-admit testing for registration on day of surgery. Pt voices understanding.

## 2016-09-22 NOTE — Telephone Encounter (Signed)
-----   Message from Nickie Retort, MD sent at 09/11/2016  8:19 AM EST ----- I was unable to get to stone today. I placed stent. He will need repeat left URS in a few weeks. Thanks.

## 2016-09-23 LAB — CULTURE, URINE COMPREHENSIVE

## 2016-09-24 MED ORDER — SODIUM CHLORIDE FLUSH 0.9 % IV SOLN
INTRAVENOUS | Status: AC
  Filled 2016-09-24: qty 10

## 2016-09-28 ENCOUNTER — Ambulatory Visit: Payer: Medicare Other | Admitting: Anesthesiology

## 2016-09-28 ENCOUNTER — Ambulatory Visit
Admission: RE | Admit: 2016-09-28 | Discharge: 2016-09-28 | Disposition: A | Discharge status: Home / Self Care | Payer: Medicare Other | Source: Ambulatory Visit | Attending: Urology | Admitting: Urology

## 2016-09-28 ENCOUNTER — Encounter: Payer: Self-pay | Admitting: *Deleted

## 2016-09-28 ENCOUNTER — Encounter
Admission: RE | Disposition: A | Discharge status: Home / Self Care | Payer: Self-pay | Source: Ambulatory Visit | Attending: Urology

## 2016-09-28 DIAGNOSIS — N202 Calculus of kidney with calculus of ureter: Secondary | ICD-10-CM | POA: Diagnosis not present

## 2016-09-28 DIAGNOSIS — E119 Type 2 diabetes mellitus without complications: Secondary | ICD-10-CM | POA: Diagnosis not present

## 2016-09-28 DIAGNOSIS — J449 Chronic obstructive pulmonary disease, unspecified: Secondary | ICD-10-CM | POA: Diagnosis not present

## 2016-09-28 DIAGNOSIS — G4730 Sleep apnea, unspecified: Secondary | ICD-10-CM | POA: Diagnosis not present

## 2016-09-28 DIAGNOSIS — M1990 Unspecified osteoarthritis, unspecified site: Secondary | ICD-10-CM | POA: Insufficient documentation

## 2016-09-28 DIAGNOSIS — H409 Unspecified glaucoma: Secondary | ICD-10-CM | POA: Diagnosis not present

## 2016-09-28 DIAGNOSIS — Z85820 Personal history of malignant melanoma of skin: Secondary | ICD-10-CM | POA: Insufficient documentation

## 2016-09-28 DIAGNOSIS — I10 Essential (primary) hypertension: Secondary | ICD-10-CM | POA: Insufficient documentation

## 2016-09-28 DIAGNOSIS — E785 Hyperlipidemia, unspecified: Secondary | ICD-10-CM | POA: Insufficient documentation

## 2016-09-28 DIAGNOSIS — N201 Calculus of ureter: Secondary | ICD-10-CM | POA: Diagnosis not present

## 2016-09-28 DIAGNOSIS — K219 Gastro-esophageal reflux disease without esophagitis: Secondary | ICD-10-CM | POA: Insufficient documentation

## 2016-09-28 DIAGNOSIS — Z87891 Personal history of nicotine dependence: Secondary | ICD-10-CM | POA: Diagnosis not present

## 2016-09-28 HISTORY — PX: CYSTOSCOPY W/ URETERAL STENT PLACEMENT: SHX1429

## 2016-09-28 HISTORY — PX: URETEROSCOPY WITH HOLMIUM LASER LITHOTRIPSY: SHX6645

## 2016-09-28 LAB — GLUCOSE, CAPILLARY
GLUCOSE-CAPILLARY: 144 mg/dL — AB (ref 65–99)
Glucose-Capillary: 157 mg/dL — ABNORMAL HIGH (ref 65–99)

## 2016-09-28 SURGERY — URETEROSCOPY, WITH LITHOTRIPSY USING HOLMIUM LASER
Anesthesia: General | Site: Ureter | Laterality: Left | Wound class: Clean Contaminated

## 2016-09-28 MED ORDER — PHENYLEPHRINE HCL 10 MG/ML IJ SOLN
INTRAVENOUS | Status: DC | PRN
  Administered 2016-09-28: 35 ug/min via INTRAVENOUS

## 2016-09-28 MED ORDER — MIDAZOLAM HCL 2 MG/2ML IJ SOLN
INTRAMUSCULAR | Status: DC | PRN
  Administered 2016-09-28: 1 mg via INTRAVENOUS

## 2016-09-28 MED ORDER — FENTANYL CITRATE (PF) 100 MCG/2ML IJ SOLN: 25.0000 ug | INTRAMUSCULAR | Status: DC | PRN

## 2016-09-28 MED ORDER — SUGAMMADEX SODIUM 200 MG/2ML IV SOLN
INTRAVENOUS | Status: DC | PRN
  Administered 2016-09-28: 200 mg via INTRAVENOUS

## 2016-09-28 MED ORDER — HYDROCODONE-ACETAMINOPHEN 5-325 MG PO TABS: 1.0000 | ORAL_TABLET | ORAL | 0 refills | Status: AC | PRN

## 2016-09-28 MED ORDER — GENTAMICIN IN SALINE 1.6-0.9 MG/ML-% IV SOLN
80.0000 mg | INTRAVENOUS | Status: AC
  Administered 2016-09-28: 80 mg via INTRAVENOUS
  Filled 2016-09-28: qty 50

## 2016-09-28 MED ORDER — FENTANYL CITRATE (PF) 100 MCG/2ML IJ SOLN
INTRAMUSCULAR | Status: DC | PRN
  Administered 2016-09-28: 100 ug via INTRAVENOUS
  Administered 2016-09-28: 150 ug via INTRAVENOUS

## 2016-09-28 MED ORDER — LIDOCAINE HCL (CARDIAC) 20 MG/ML IV SOLN
INTRAVENOUS | Status: DC | PRN
  Administered 2016-09-28: 100 mg via INTRAVENOUS

## 2016-09-28 MED ORDER — CIPROFLOXACIN IN D5W 400 MG/200ML IV SOLN
INTRAVENOUS | Status: AC
  Filled 2016-09-28: qty 200

## 2016-09-28 MED ORDER — DOCUSATE SODIUM 100 MG PO CAPS: 100.0000 mg | ORAL_CAPSULE | Freq: Two times a day (BID) | ORAL | 0 refills | Status: AC

## 2016-09-28 MED ORDER — PROPOFOL 10 MG/ML IV BOLUS
INTRAVENOUS | Status: DC | PRN
  Administered 2016-09-28: 200 mg via INTRAVENOUS

## 2016-09-28 MED ORDER — SUCCINYLCHOLINE CHLORIDE 20 MG/ML IJ SOLN
INTRAMUSCULAR | Status: DC | PRN
  Administered 2016-09-28: 100 mg via INTRAVENOUS

## 2016-09-28 MED ORDER — ONDANSETRON HCL 4 MG/2ML IJ SOLN
INTRAMUSCULAR | Status: AC
  Filled 2016-09-28: qty 2

## 2016-09-28 MED ORDER — IOTHALAMATE MEGLUMINE 43 % IV SOLN
INTRAVENOUS | Status: DC | PRN
  Administered 2016-09-28: 15 mL

## 2016-09-28 MED ORDER — DEXAMETHASONE SODIUM PHOSPHATE 10 MG/ML IJ SOLN
INTRAMUSCULAR | Status: AC
  Filled 2016-09-28: qty 1

## 2016-09-28 MED ORDER — GLYCOPYRROLATE 0.2 MG/ML IJ SOLN
INTRAMUSCULAR | Status: AC
  Filled 2016-09-28: qty 1

## 2016-09-28 MED ORDER — OXYBUTYNIN CHLORIDE 5 MG PO TABS: 5.0000 mg | ORAL_TABLET | Freq: Three times a day (TID) | ORAL | 0 refills | Status: AC | PRN

## 2016-09-28 MED ORDER — FENTANYL CITRATE (PF) 250 MCG/5ML IJ SOLN
INTRAMUSCULAR | Status: AC
  Filled 2016-09-28: qty 5

## 2016-09-28 MED ORDER — PROPOFOL 10 MG/ML IV BOLUS
INTRAVENOUS | Status: AC
  Filled 2016-09-28: qty 20

## 2016-09-28 MED ORDER — MIDAZOLAM HCL 2 MG/2ML IJ SOLN
INTRAMUSCULAR | Status: AC
  Filled 2016-09-28: qty 2

## 2016-09-28 MED ORDER — SODIUM CHLORIDE 0.9 % IV SOLN
INTRAVENOUS | Status: DC
  Administered 2016-09-28: 08:00:00 via INTRAVENOUS

## 2016-09-28 MED ORDER — DEXAMETHASONE SODIUM PHOSPHATE 4 MG/ML IJ SOLN
INTRAMUSCULAR | Status: DC | PRN
  Administered 2016-09-28: 5 mg via INTRAVENOUS

## 2016-09-28 MED ORDER — ONDANSETRON HCL 4 MG/2ML IJ SOLN: 4.0000 mg | Freq: Once | INTRAMUSCULAR | Status: DC | PRN

## 2016-09-28 MED ORDER — ROCURONIUM BROMIDE 100 MG/10ML IV SOLN
INTRAVENOUS | Status: DC | PRN
  Administered 2016-09-28: 40 mg via INTRAVENOUS

## 2016-09-28 MED ORDER — PHENYLEPHRINE HCL 10 MG/ML IJ SOLN
INTRAMUSCULAR | Status: DC | PRN
  Administered 2016-09-28: 100 ug via INTRAVENOUS
  Administered 2016-09-28 (×2): 200 ug via INTRAVENOUS

## 2016-09-28 MED ORDER — LIDOCAINE HCL (PF) 2 % IJ SOLN
INTRAMUSCULAR | Status: AC
  Filled 2016-09-28: qty 4

## 2016-09-28 MED ORDER — CIPROFLOXACIN IN D5W 400 MG/200ML IV SOLN
400.0000 mg | INTRAVENOUS | Status: AC
Start: 2016-09-28 — End: 2016-09-28
  Administered 2016-09-28: 400 mg via INTRAVENOUS

## 2016-09-28 MED ORDER — TAMSULOSIN HCL 0.4 MG PO CAPS: 0.4000 mg | ORAL_CAPSULE | Freq: Every day | ORAL | 0 refills | Status: AC

## 2016-09-28 SURGICAL SUPPLY — 32 items
ADHESIVE MASTISOL STRL (MISCELLANEOUS) ×3 IMPLANT
BAG DRAIN CYSTO-URO LG1000N (MISCELLANEOUS) ×3 IMPLANT
BASKET ZERO TIP 1.9FR (BASKET) IMPLANT
CATH URETL 5X70 OPEN END (CATHETERS) ×3 IMPLANT
CNTNR SPEC 2.5X3XGRAD LEK (MISCELLANEOUS)
CONRAY 43 FOR UROLOGY 50M (MISCELLANEOUS) ×3 IMPLANT
CONT SPEC 4OZ STER OR WHT (MISCELLANEOUS)
CONTAINER SPEC 2.5X3XGRAD LEK (MISCELLANEOUS) IMPLANT
DRAPE UTILITY 15X26 TOWEL STRL (DRAPES) ×3 IMPLANT
DRSG TEGADERM 2-3/8X2-3/4 SM (GAUZE/BANDAGES/DRESSINGS) ×3 IMPLANT
FIBER LASER LITHO 273 (Laser) ×3 IMPLANT
GLOVE BIO SURGEON STRL SZ 6.5 (GLOVE) ×2 IMPLANT
GLOVE BIO SURGEONS STRL SZ 6.5 (GLOVE) ×1
GOWN STRL REUS W/ TWL LRG LVL3 (GOWN DISPOSABLE) ×2 IMPLANT
GOWN STRL REUS W/TWL LRG LVL3 (GOWN DISPOSABLE) ×4
GUIDEWIRE GREEN .038 145CM (MISCELLANEOUS) ×3 IMPLANT
INFUSOR MANOMETER BAG 3000ML (MISCELLANEOUS) ×3 IMPLANT
INTRODUCER DILATOR DOUBLE (INTRODUCER) IMPLANT
KIT RM TURNOVER CYSTO AR (KITS) ×3 IMPLANT
PACK CYSTO AR (MISCELLANEOUS) ×3 IMPLANT
SCRUB POVIDONE IODINE 4 OZ (MISCELLANEOUS) IMPLANT
SENSORWIRE 0.038 NOT ANGLED (WIRE) ×3
SET CYSTO W/LG BORE CLAMP LF (SET/KITS/TRAYS/PACK) ×3 IMPLANT
SHEATH URETERAL 12FRX35CM (MISCELLANEOUS) IMPLANT
SOL .9 NS 3000ML IRR  AL (IV SOLUTION) ×2
SOL .9 NS 3000ML IRR UROMATIC (IV SOLUTION) ×1 IMPLANT
STENT URET 6FRX24 CONTOUR (STENTS) IMPLANT
STENT URET 6FRX26 CONTOUR (STENTS) ×3 IMPLANT
SURGILUBE 2OZ TUBE FLIPTOP (MISCELLANEOUS) ×3 IMPLANT
SYRINGE IRR TOOMEY STRL 70CC (SYRINGE) ×3 IMPLANT
WATER STERILE IRR 1000ML POUR (IV SOLUTION) ×3 IMPLANT
WIRE SENSOR 0.038 NOT ANGLED (WIRE) ×1 IMPLANT

## 2016-09-28 NOTE — Op Note (Signed)
Date of procedure: 09/28/16  Preoperative diagnosis:  1. Left distal ureteral stone   Postoperative diagnosis:  1. Same as above   Procedure: 1. Left ureteroscopy 2. Laser lithotripsy 3. Left retrograde pyelogram 4. Basket extraction of Stone fragment 5. Left ureteral stent exchange  Surgeon: Hollice Espy, MD  Anesthesia: General  Complications: None  Intraoperative findings: 7 mm left distal ureteral stone treated, ureter appeared intact with minimal trauma/edema  EBL: Minimal  Specimens: Stone fragment  Drains: 6 x 26 French double-J ureteral stent with string left in place, left side  Indication: Adrian Harris. is a 66 y.o. patient with 7 mm left distal ureteral stone who previously underwent ureteral stent placement. He returns today for definitive management of his stone..  After reviewing the management options for treatment, he elected to proceed with the above surgical procedure(s). We have discussed the potential benefits and risks of the procedure, side effects of the proposed treatment, the likelihood of the patient achieving the goals of the procedure, and any potential problems that might occur during the procedure or recuperation. Informed consent has been obtained.  Description of procedure:  The patient was taken to the operating room and general anesthesia was induced.  The patient was placed in the dorsal lithotomy position, prepped and draped in the usual sterile fashion, and preoperative antibiotics were administered. A preoperative time-out was performed.   A 21 French cystoscope was advanced per urethra into the bladder. Attention was turned to the left ureteral orifice from which a ureteral stent was seen emanating. The distal aspect of the coil was grasped using stent graspers and brought to level of the urethral meatus.  The stent was then cannulated using a sensor wire up to level of the kidney. The wire remained in place and the stent was removed. A  4.5 French semirigid ureteroscope was then advanced through the distal ureter up to level of the stone. A 273  laser fiber was then brought in and using settings of 0.8 J and 10 Hz, the stone was fragmented into approximately 10 or so smaller pieces. Each of these were then extracted via basket without difficulty or trauma.  The scope was then able to be advanced up to level of the iliacs but not above. There was some concern for retropulsion of stone material therefore a superstiff wire was advanced level of the kidney under fluoroscopic guidance. A flexible 7 [place] Wolf ureteroscope was then advanced up to level of the renal pelvis under fluoroscopic guidance quite easily. Pyeloscopy was then performed and no obvious stones were identified within the upper tracts. Contrast was injected into the renal pelvis which appeared decompressed left filling defects. The scope was then backed down the length of the ureter inspecting along the way. There is no additional stones or stone fragments identified. The distal ureter appeared to be intact with minimal trauma and edema. The scope was then removed. The safety wire was backloaded over a rigid cystoscope. A 6 x 26 French double-J ureteral stent was then advanced over the safety wire up to level of the renal pelvis. The wire was partially withdrawn until full coil was noted within the renal pelvis. The wire was fully withdrawn until full coil was noted within the bladder. The bladder was then drained and a few of the stone fragments were evacuated and passed off the field as a specimen. The patient was then cleaned and dried. The stent string was affixed to the patient's glans using Mastisol and Tegaderm.  His foreskin was reduced. He was then repositioned the supine position, reversed from anesthesia, and taken to PACU in stable condition.  Plan: Patient will remove his own stent on Thursday. He will follow up in approximately 1 month with a renal ultrasound  prior.  Hollice Espy, M.D.

## 2016-09-28 NOTE — Anesthesia Procedure Notes (Signed)
Procedure Name: Intubation Date/Time: 09/28/2016 9:07 AM Performed by: Rosaria Ferries, Karianna Gusman Pre-anesthesia Checklist: Patient identified, Emergency Drugs available, Suction available and Patient being monitored Patient Re-evaluated:Patient Re-evaluated prior to inductionOxygen Delivery Method: Circle system utilized Preoxygenation: Pre-oxygenation with 100% oxygen Intubation Type: IV induction Laryngoscope Size: Mac and 3 Grade View: Grade I Tube size: 7.0 mm Number of attempts: 1 Placement Confirmation: ETT inserted through vocal cords under direct vision,  positive ETCO2 and breath sounds checked- equal and bilateral Secured at: 23 cm Tube secured with: Tape Dental Injury: Teeth and Oropharynx as per pre-operative assessment

## 2016-09-28 NOTE — Discharge Instructions (Signed)
You have a ureteral stent in place.  This is a tube that extends from your kidney to your bladder.  This may cause urinary bleeding, burning with urination, and urinary frequency.  Please call our office or present to the ED if you develop fevers >101 or pain which is not able to be controlled with oral pain medications.  You may be given either Flomax and/ or ditropan to help with bladder spasms and stent pain in addition to pain medications.    Your stent is attached to a string taped to your penis.  On Thursday, please pull gently until entire stent is removed.  If you have trouble, please call our office.  It is normal to have some mild pain and cramping for up to 24 hours after stent removal.    [facility] [address], [place], [place] [address] [phone] SURGERY  DISCHARGE INSTRUCTIONS   1) The drugs that you were given will stay in your system until tomorrow so for the next 24 hours you should not:  A) Drive an automobile B) Make any legal decisions C) Drink any alcoholic beverage   2) You may resume regular meals tomorrow.  Today it is better to start with liquids and gradually work up to solid foods.  You may eat anything you prefer, but it is better to start with liquids, then soup and crackers, and gradually work up to solid foods.   3) Please notify your doctor immediately if you have any unusual bleeding, trouble breathing, redness and pain at the surgery site, drainage, fever, or pain not relieved by medication.    4) Additional Instructions:        Please contact your physician with any problems or Same Day Surgery at [phone], Monday through Friday 6 am to 4 pm, or [facility] number at [phone].

## 2016-09-28 NOTE — OR Nursing (Signed)
Urine strainer sent home with patient with instruction.

## 2016-09-28 NOTE — Interval H&P Note (Signed)
History and Physical Interval Note:  09/28/2016 8:44 AM  Adrian Harris.  has presented today for surgery, with the diagnosis of LEFT URETERAL STONE  The various methods of treatment have been discussed with the patient and family. After consideration of risks, benefits and other options for treatment, the patient has consented to  Procedure(s): URETEROSCOPY WITH HOLMIUM LASER LITHOTRIPSY (Left) CYSTOSCOPY WITH STENT REPLACEMENT (Left) as a surgical intervention .  The patient's history has been reviewed, patient examined, no change in status, stable for surgery.  I have reviewed the patient's chart and labs.  Questions were answered to the patient's satisfaction.    RRR CTAB  UCx negative  Hollice Espy

## 2016-09-28 NOTE — Anesthesia Postprocedure Evaluation (Signed)
Anesthesia Post Note  Patient: Adrian Harris.  Procedure(s) Performed: Procedure(s) (LRB): URETEROSCOPY WITH HOLMIUM LASER LITHOTRIPSY (Left) CYSTOSCOPY WITH STENT REPLACEMENT (Left)  Patient location during evaluation: PACU Anesthesia Type: General Level of consciousness: awake and alert and oriented Pain management: pain level controlled Vital Signs Assessment: post-procedure vital signs reviewed and stable Respiratory status: spontaneous breathing Cardiovascular status: blood pressure returned to baseline Anesthetic complications: no     Last Vitals:  Vitals:   09/28/16 1043 09/28/16 1124  BP: 136/77 128/70  Pulse: 83 87  Resp: 16 16  Temp: 36.8 C 36.7 C    Last Pain:  Vitals:   09/28/16 1124  TempSrc: Temporal  PainSc: 2                  Kaavya Puskarich

## 2016-09-28 NOTE — Anesthesia Post-op Follow-up Note (Cosign Needed)
Anesthesia QCDR form completed.        

## 2016-09-28 NOTE — Transfer of Care (Signed)
Immediate Anesthesia Transfer of Care Note  Patient: Adrian Harris.  Procedure(s) Performed: Procedure(s): URETEROSCOPY WITH HOLMIUM LASER LITHOTRIPSY (Left) CYSTOSCOPY WITH STENT REPLACEMENT (Left)  Patient Location: PACU  Anesthesia Type:General  Level of Consciousness: sedated  Airway & Oxygen Therapy: Patient Spontanous Breathing and Patient connected to face mask oxygen  Post-op Assessment: Report given to RN and Post -op Vital signs reviewed and stable  Post vital signs: Reviewed and stable  Last Vitals:  Vitals:   09/28/16 0728  BP: (!) 122/98  Pulse: (!) 101  Resp: 16  Temp: 36.2 C    Last Pain:  Vitals:   09/28/16 0728  TempSrc: Tympanic         Complications: No apparent anesthesia complications

## 2016-09-28 NOTE — H&P (View-Only) (Signed)
@ENCDATE @ 7:26 AM   Adrian Harris. 04-13-1951 [id number]  CC: Nephrolithiasis  HPI: Mr Hoggatt is a 66yo seen today for evaluation of gross hematuria. 3 weeks ago he had an episode of gross painless hematuria. No hx of nephroloithiasis. He has mild LUTS with nocturia 1x on occasion. Creatinine 0.8. He is a retired Art therapist. No hx of UTI or prostate infections.   CT Urogram shopwed distal left 5x7 mm ureteral calculus. He also has a 3 mm nonobstructing left mid pole stone.     PMH: Past Medical History:  Diagnosis Date  . Acid reflux   . Arthritis   . Asthma   . Cancer ([place])    melanoma  back  . COPD (chronic obstructive pulmonary disease) ([place])   . Diabetes mellitus without complication ([place])   . Glaucoma   . Hyperlipidemia   . Hypertension   . Malignant hyperthermia   . PONV (postoperative nausea and vomiting)   . Sleep apnea     Surgical History: Past Surgical History:  Procedure Laterality Date  . APPENDECTOMY  1962  . BACK SURGERY    . 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 EXCISION MORTON'S NEUROMA Left 2008  . KNEE ARTHROSCOPY Left 1998  . NECK SURGERY      Home Medications:    Allergies: No Known Allergies  Family History: Family History  Problem Relation Age of Onset  . Hematuria Neg Hx   . Kidney cancer Neg Hx   . Prostate cancer Neg Hx     Social History:  reports that he quit smoking about 41 years ago. His smoking use included Cigarettes. He has a 80.00 pack-year smoking history. He has never used smokeless tobacco. He reports that he does not drink alcohol or use drugs.  ROS: 12 point ROS negative except for above                                        Physical Exam: BP (!) 189/92   Pulse 82   Temp 97 F (36.1 C) (Temporal)   Resp 14   SpO2 98%   Constitutional:  Alert and oriented, No acute distress. HEENT: [place] AT, moist mucus membranes.  Trachea midline, no  masses. Cardiovascular: No clubbing, cyanosis, or edema. RRR Respiratory: Normal respiratory effort, no increased work of breathing. Unlabored resp GI: Abdomen is soft, nontender, nondistended, no abdominal masses GU: No CVA tenderness.  Skin: No rashes, bruises or suspicious lesions. Lymph: No cervical or inguinal adenopathy. Neurologic: Grossly intact, no focal deficits, moving all 4 extremities. Psychiatric: Normal mood and affect.  Laboratory Data: No results found for: WBC, HGB, HCT, MCV, PLT  Lab Results  Component Value Date   CREATININE 1.00 07/13/2016    No results found for: PSA  No results found for: TESTOSTERONE  No results found for: HGBA1C  Urinalysis    Component Value Date/Time   APPEARANCEUR Hazy (A) 09/07/2016 1009   GLUCOSEU 3+ (A) 09/07/2016 1009   BILIRUBINUR Negative 09/07/2016 1009   PROTEINUR Negative 09/07/2016 1009   NITRITE Negative 09/07/2016 1009   LEUKOCYTESUR Negative 09/07/2016 1009   Assessment & Plan:    1. Left ureteral stone/renal stone I discussed with the patient that his left ureteral stone has likely been there for some time as he is asymptomatic from its obstruction this time. I  did share with him my concern that this can eventually cause him to lose his left renal unit if left untreated. We discussed management options. We discussed both ureteroscopy and lithotripsy. Due to the location the distal ureter I recommended left ureteroscopy is a better means to render him potentially stone free with one surgery. He also has a small stone in his left renal pelvis. We discussed that we will try to remove the stone as well if is easily accessible the time of surgery. We discussed the risks, benefits, and indications of cystoscopy, ureteroscopy, laser lithotripsy, and left ureteral stent placement. He understands the risks include but are not limited to bleeding, infection, need for repeat procedures, iatrogenic injury among others. All questions  were answered. The patient has elected to proceed.  2. Gross hematuria Negative work up except for above  Nickie Retort, [facility] [address], [place], [place] [address] [phone]

## 2016-09-28 NOTE — OR Nursing (Signed)
String to Stent in place at end of penis.  Patient was able to void.  Desires to go home at this time.

## 2016-09-28 NOTE — Anesthesia Preprocedure Evaluation (Signed)
Anesthesia Evaluation  Patient identified by MRN, date of birth, ID band Patient awake    Reviewed: Allergy & Precautions, NPO status , Patient's Chart, lab work & pertinent test results, reviewed documented beta blocker date and time   History of Anesthesia Complications (+) PONV  Airway Mallampati: III  TM Distance: >3 FB     Dental  (+) Upper Dentures, Lower Dentures   Pulmonary asthma , sleep apnea , COPD,  COPD inhaler, former smoker,    Pulmonary exam normal        Cardiovascular hypertension, Pt. on medications Normal cardiovascular exam     Neuro/Psych negative neurological ROS  negative psych ROS   GI/Hepatic Neg liver ROS, GERD  Controlled,  Endo/Other  diabetes, Type 2  Renal/GU negative Renal ROS  negative genitourinary   Musculoskeletal  (+) Arthritis ,   Abdominal Normal abdominal exam  (+)   Peds negative pediatric ROS (+)  Hematology negative hematology ROS (+)   Anesthesia Other Findings Cervical fusion, neck movement ok. No malignant hyperthermia in him or relatives. No CPAP.  Reproductive/Obstetrics                            Anesthesia Physical  Anesthesia Plan  ASA: III  Anesthesia Plan: General   Post-op Pain Management:    Induction: Intravenous  Airway Management Planned: Oral ETT  Additional Equipment:   Intra-op Plan:   Post-operative Plan:   Informed Consent: I have reviewed the patients History and Physical, chart, labs and discussed the procedure including the risks, benefits and alternatives for the proposed anesthesia with the patient or authorized representative who has indicated his/her understanding and acceptance.     Plan Discussed with: CRNA  Anesthesia Plan Comments:        Anesthesia Quick Evaluation

## 2016-10-06 LAB — STONE ANALYSIS
CA OXALATE, MONOHYDR.: 90 %
Ca Oxalate,Dihydrate: 5 %
Ca phos cry stone ql IR: 5 %
STONE WEIGHT KSTONE: 23.1 mg

## 2016-10-26 ENCOUNTER — Ambulatory Visit
Admission: RE | Admit: 2016-10-26 | Discharge: 2016-10-26 | Disposition: A | Discharge status: Home / Self Care | Payer: Medicare Other | Source: Ambulatory Visit | Attending: Urology | Admitting: Urology

## 2016-10-26 DIAGNOSIS — N201 Calculus of ureter: Secondary | ICD-10-CM | POA: Diagnosis not present

## 2016-11-06 ENCOUNTER — Encounter: Payer: Self-pay | Admitting: Urology

## 2016-11-06 ENCOUNTER — Ambulatory Visit: Payer: Medicare Other | Admitting: Urology

## 2016-11-06 VITALS — BP 116/82 | HR 78 | Ht 73.0 in | Wt 262.1 lb

## 2016-11-06 DIAGNOSIS — R310 Gross hematuria: Secondary | ICD-10-CM | POA: Diagnosis not present

## 2016-11-06 DIAGNOSIS — Z87442 Personal history of urinary calculi: Secondary | ICD-10-CM

## 2016-11-06 NOTE — Progress Notes (Signed)
11/06/2016 4:27 PM   Valarie Merino. [birth year] [id number]  Referring provider: Sofie Hartigan, MD [place], [place] [address]  Chief Complaint  Patient presents with  . Follow-up    Renal US results    HPI: 66 year old male with nephrolithiasis 7 mm left distal ureteral stone who underwent staged left ureteroscopy on 09/28/2016.  He removed his own stent and presents today for follow-up.  Follow-up renal ultrasound on 10/26/2016 shows no evidence of hydroureteronephrosis or residual stone fragments identified. He does have an incidental 4 cm right upper pole simple renal cyst. Additionally, his bladder was distended.  Stone analysis consistent with 90% calcium oxalate monohydrate, 5% calcium oxalate dihydrate, 5% calcium phosphate.  Prior to the stone, he has no personal history of kidney stones.  Today, he denies any flank pain or gross hematuria. He has no significant voiding symptoms.   PMH: Past Medical History:  Diagnosis Date  . Acid reflux   . Arthritis   . Asthma   . Cancer ([place])    melanoma  back  . COPD (chronic obstructive pulmonary disease) ([place])   . Diabetes mellitus without complication ([place])   . Glaucoma   . Hyperlipidemia   . Hypertension   . PONV (postoperative nausea and vomiting)   . Sleep apnea     Surgical History: Past Surgical History:  Procedure Laterality Date  . APPENDECTOMY  1962  . BACK SURGERY    . CYSTOSCOPY W/ URETERAL STENT PLACEMENT Left 09/28/2016   Procedure: CYSTOSCOPY WITH STENT REPLACEMENT;  Surgeon: Hollice Espy, MD;  Location: [facility] ORS;  Service: Urology;  Laterality: Left;  . CYSTOSCOPY WITH STENT PLACEMENT Left 09/11/2016   Procedure: CYSTOSCOPY WITH STENT PLACEMENT;  Surgeon: Nickie Retort, MD;  Location: [facility] ORS;  Service: Urology;  Laterality: Left;  . 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 EXCISION MORTON'S NEUROMA Left 2008  . KNEE  ARTHROSCOPY Left 1998  . NECK SURGERY    . URETEROSCOPY WITH HOLMIUM LASER LITHOTRIPSY Left 09/11/2016   Procedure: URETEROSCOPY WITH HOLMIUM LASER LITHOTRIPSY;  Surgeon: Nickie Retort, MD;  Location: [facility] ORS;  Service: Urology;  Laterality: Left;  . URETEROSCOPY WITH HOLMIUM LASER LITHOTRIPSY Left 09/28/2016   Procedure: URETEROSCOPY WITH HOLMIUM LASER LITHOTRIPSY;  Surgeon: Hollice Espy, MD;  Location: [facility] ORS;  Service: Urology;  Laterality: Left;    Home Medications:  Allergies as of 11/06/2016   No Known Allergies     Medication List       Accurate as of 11/06/16 11:59 PM. Always use your most recent med list.          ACCU-CHEK COMFORT CURVE test strip Generic drug:  glucose blood   acetaminophen 650 MG CR tablet Commonly known as:  TYLENOL Take 1,300 mg by mouth every 8 (eight) hours as needed for pain.   B-6 PO Take 1 tablet by mouth daily.   benzonatate 100 MG capsule Commonly known as:  TESSALON Take 100 mg by mouth 3 (three) times daily as needed for cough.   cetirizine 10 MG tablet Commonly known as:  ZYRTEC Take 10 mg by mouth daily.   [employer] D3 2000 units Caps Generic drug:  Cholecalciferol Take 4,000 Units by mouth daily.   docusate sodium 100 MG capsule Commonly known as:  COLACE Take 1 capsule (100 mg total) by mouth 2 (two) times daily.   Fish Oil 1000 MG Caps  Take by mouth.   fluticasone 50 MCG/ACT nasal spray Commonly known as:  FLONASE Place 1 spray into both nostrils daily.   HYDROcodone-acetaminophen 5-325 MG tablet Commonly known as:  NORCO Take 1-2 tablets by mouth every 4 (four) hours as needed for moderate pain.   levofloxacin 500 MG tablet Commonly known as:  LEVAQUIN Take 1 tablet (500 mg total) by mouth daily.   lisinopril 20 MG tablet Commonly known as:  PRINIVIL,ZESTRIL Take 20 mg by mouth daily.   loratadine 10 MG tablet Commonly known as:  CLARITIN Take 10 mg by mouth daily.   lovastatin 20 MG tablet Commonly  known as:  MEVACOR Take 20 mg by mouth at bedtime.   metFORMIN 500 MG 24 hr tablet Commonly known as:  GLUCOPHAGE-XR Take 1,000 mg by mouth daily with breakfast.   multivitamin with minerals Tabs tablet Take 1 tablet by mouth daily.   omeprazole 20 MG capsule Commonly known as:  PRILOSEC Take 20 mg by mouth daily.   oxybutynin 5 MG tablet Commonly known as:  DITROPAN Take 1 tablet (5 mg total) by mouth every 8 (eight) hours as needed for bladder spasms.   PROAIR HFA 108 (90 Base) MCG/ACT inhaler Generic drug:  albuterol Inhale 2 puffs into the lungs every 6 (six) hours as needed for wheezing or shortness of breath.   tamsulosin 0.4 MG Caps capsule Commonly known as:  FLOMAX Take 1 capsule (0.4 mg total) by mouth daily.   tiotropium 18 MCG inhalation capsule Commonly known as:  SPIRIVA Place 18 mcg into inhaler and inhale daily.   vitamin B-12 1000 MCG tablet Commonly known as:  CYANOCOBALAMIN Take 1,000 mcg by mouth daily.   ZIOPTAN 0.0015 % Soln Generic drug:  Tafluprost Place 1 drop into both eyes at bedtime.       Allergies: No Known Allergies  Family History: Family History  Problem Relation Age of Onset  . Hematuria Neg Hx   . Kidney cancer Neg Hx   . Prostate cancer Neg Hx     Social History:  reports that he quit smoking about 41 years ago. His smoking use included Cigarettes. He has a 80.00 pack-year smoking history. He has never used smokeless tobacco. He reports that he does not drink alcohol or use drugs.  ROS: UROLOGY Frequent Urination?: No Hard to postpone urination?: No Burning/pain with urination?: No Get up at night to urinate?: No Leakage of urine?: No Urine stream starts and stops?: No Trouble starting stream?: No Do you have to strain to urinate?: No Blood in urine?: No Urinary tract infection?: No Sexually transmitted disease?: No Injury to kidneys or bladder?: No Painful intercourse?: No Weak stream?: No Erection problems?:  No Penile pain?: No  Gastrointestinal Nausea?: No Vomiting?: No Indigestion/heartburn?: No Diarrhea?: No Constipation?: No  Constitutional Fever: No Night sweats?: No Weight loss?: No Fatigue?: No  Skin Skin rash/lesions?: No Itching?: No  Eyes Blurred vision?: No Double vision?: No  Ears/Nose/Throat Sore throat?: No Sinus problems?: No  Hematologic/Lymphatic Swollen glands?: No Easy bruising?: No  Cardiovascular Leg swelling?: No Chest pain?: No  Respiratory Cough?: No Shortness of breath?: No  Endocrine Excessive thirst?: No  Musculoskeletal Back pain?: No Joint pain?: No  Neurological Headaches?: No Dizziness?: No  Psychologic Depression?: No Anxiety?: No  Physical Exam: BP 116/82 (BP Location: Left Arm, Patient Position: Sitting, Cuff Size: Large)   Pulse 78   Ht 6\' 1"  (1.854 m)   Wt 262 lb 1.6 oz (118.9 kg)  BMI 34.58 kg/m   Constitutional:  Alert and oriented, No acute distress. HEENT: [place] AT, moist mucus membranes.  Trachea midline, no masses. Cardiovascular: No clubbing, cyanosis, or edema. Respiratory: Normal respiratory effort, no increased work of breathing. GI: Abdomen is soft, nontender, nondistended, no abdominal masses GU: No CVA tenderness.  Skin: No rashes, bruises or suspicious lesions. Neurologic: Grossly intact, no focal deficits, moving all 4 extremities. Psychiatric: Normal mood and affect.  Laboratory Data:  Lab Results  Component Value Date   CREATININE 1.00 07/13/2016     Urinalysis n/a  Pertinent Imaging: CLINICAL DATA:  Left ureteral stone  EXAM: RENAL / URINARY TRACT ULTRASOUND COMPLETE  COMPARISON:  07/21/2016  FINDINGS: Right Kidney:  Length: 13.5 cm. Echogenicity within normal limits. 3.5 x 4 x 3.8 cm anechoic right upper pole renal mass most consistent with a cyst. No solid mass or hydronephrosis visualized.  Left Kidney:  Length: 14.1 cm. Echogenicity within normal limits. No  mass or hydronephrosis visualized.  Bladder:  Appears normal for degree of bladder distention. Prostatic enlargement.  IMPRESSION: No obstructive uropathy.   Electronically Signed   By: Kathreen Devoid   On: 10/26/2016 15:17    Renal ultrasound personally reviewed today with the patient.  Assessment & Plan:    1. History of kidney stones Stone analysis reviewed, primarily calcium oxalate stone Follow-up renal ultrasound without any evidence of residual stone fragments or hydronephrosis Clinically doing well We discussed various treatment options including ESWL vs. ureteroscopy, laser lithotripsy, and stent. We discussed the risks and benefits of both including bleeding, infection, damage to surrounding structures, efficacy with need for possible further intervention, and need for temporary ureteral stent. Recommend f/u in 1 year with KUB to assess for any further stone development - DG Abd 1 View; Future  2. Gross hematuria Secondary to #1, resolved with treatment   Return in about 1 year (around 11/06/2017) for KUB.  Hollice Espy, MD  [facility] [address], [place], [place] [address] [phone]

## 2017-07-08 IMAGING — US US RENAL
1 series · 14 of 25 positions shown · non-contrast
Comparison: 07/21/2016

CLINICAL DATA: Left ureteral stone

EXAM:
RENAL / URINARY TRACT ULTRASOUND COMPLETE

[Series 1: us renal · 0.26mm/px · 14 of 34 slices shown]
[im 1/34]
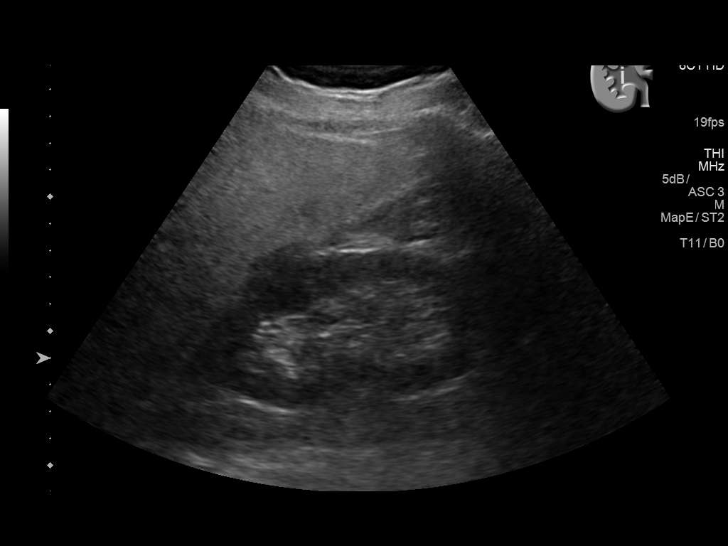
[im 3/34]
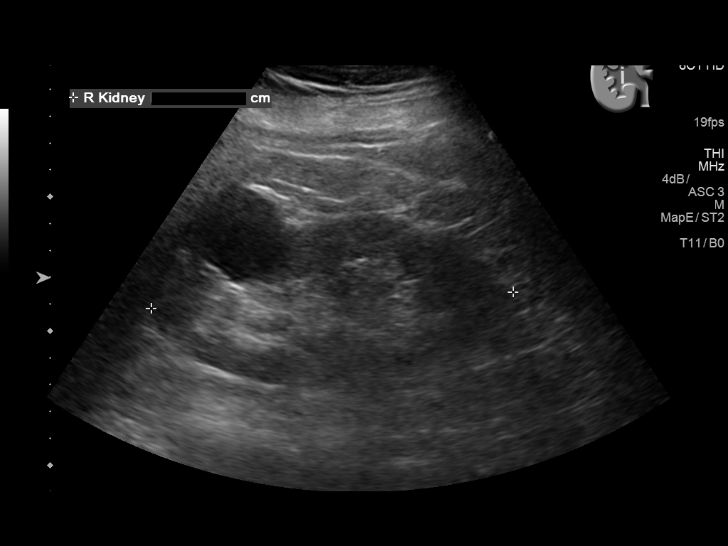
[im 6/34]
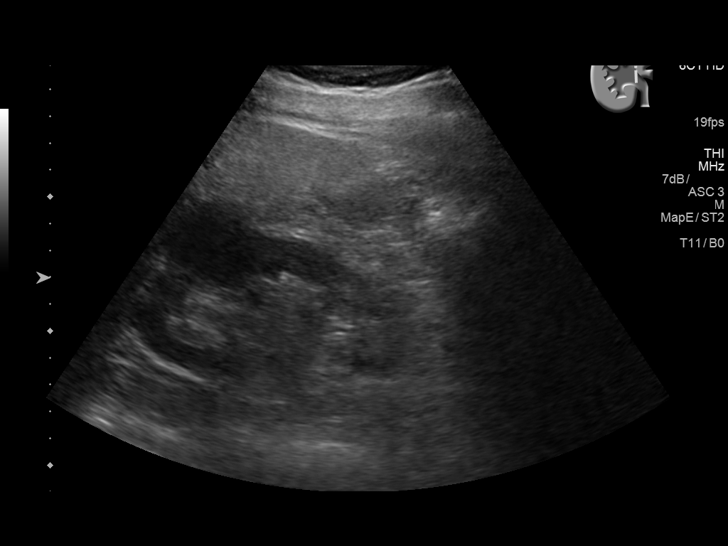
[im 9/34]
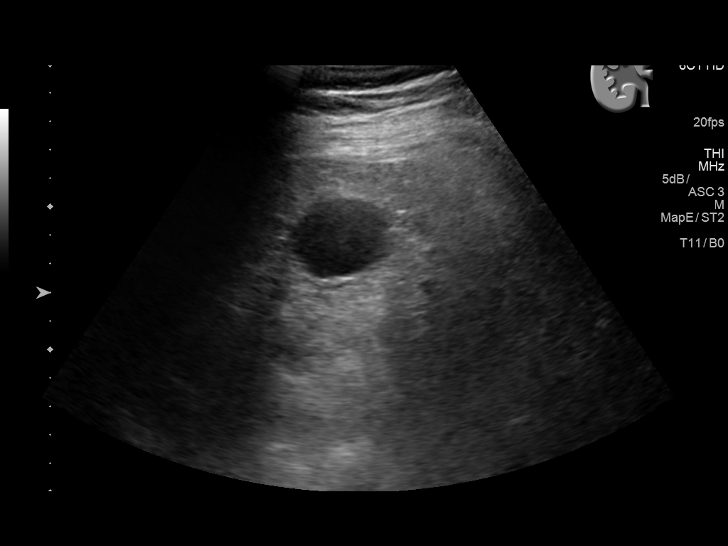
[im 12/34]
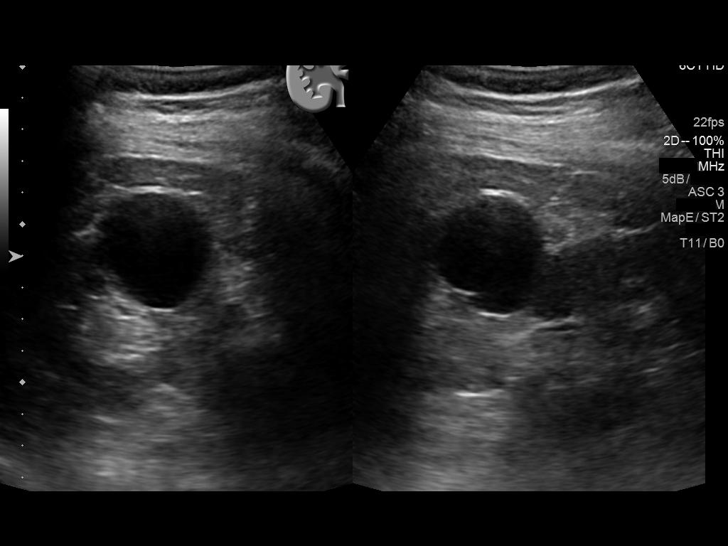
[im 13/34]
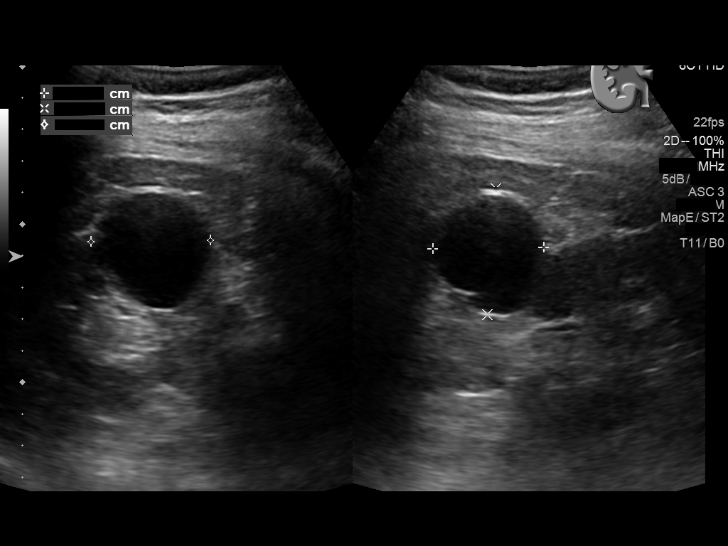
[im 16/34]
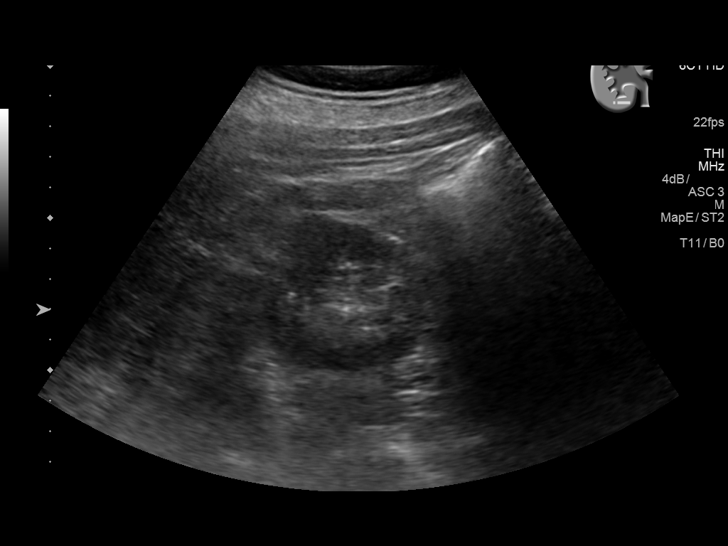
[im 18/34]
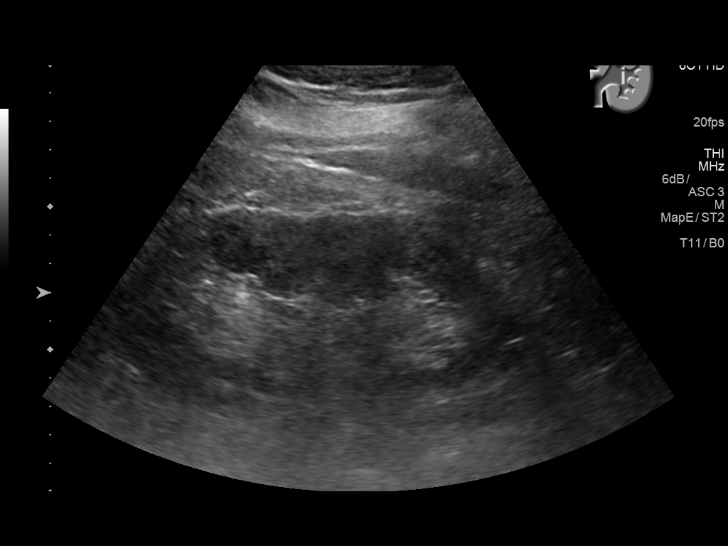
[im 21/34]
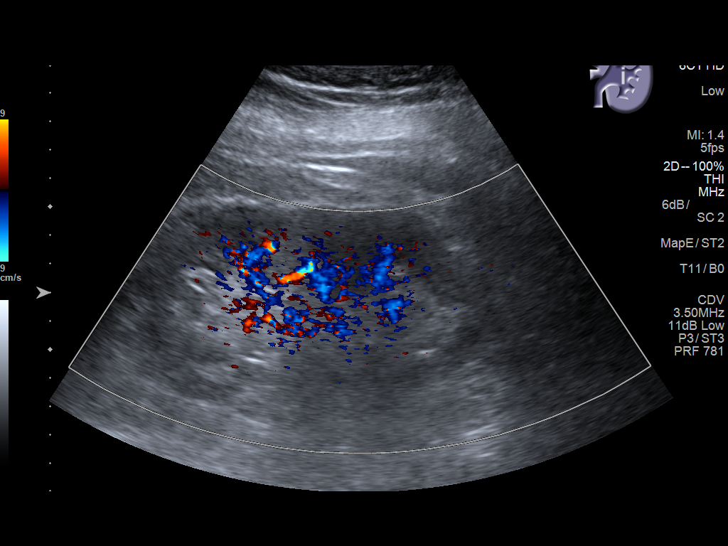
[im 23/34]
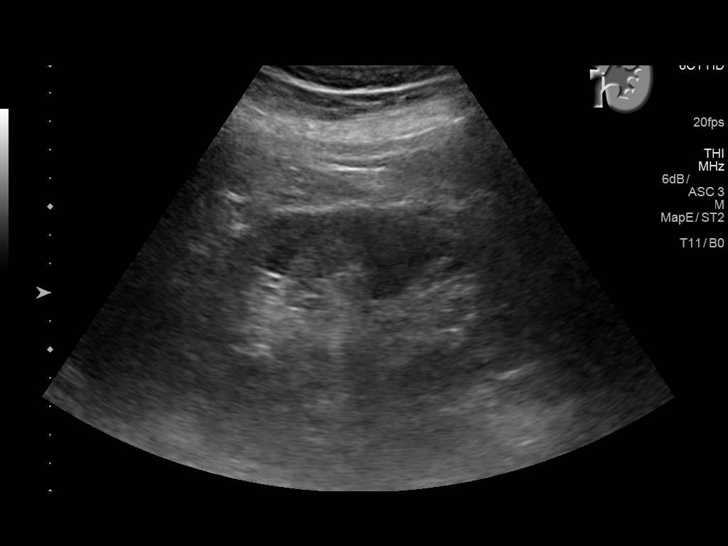
[im 25/34]
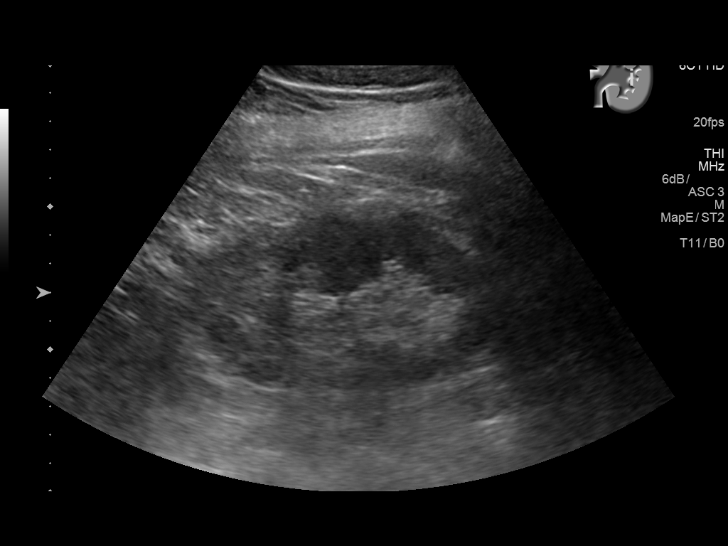
[im 28/34]
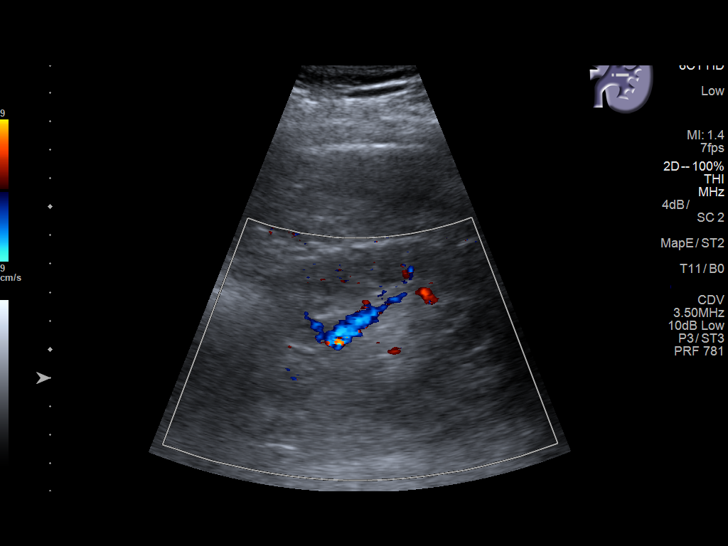
[im 31/34]
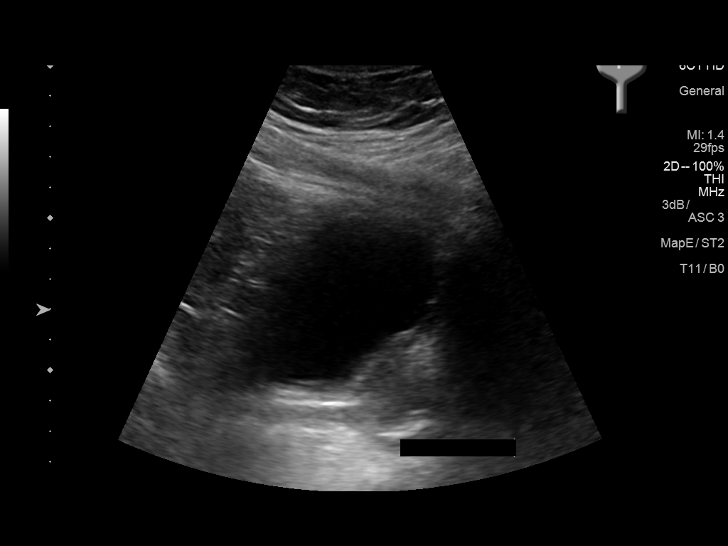
[im 34/34]
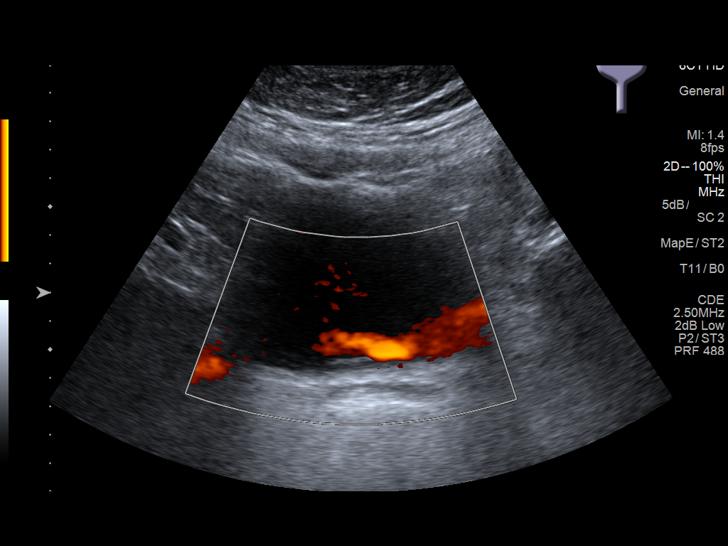

[14 of 25 positions shown; findings below may reference images not displayed]

FINDINGS: Right Kidney:

Length: 13.5 cm. Echogenicity within normal limits. 3.5 x 4 x 3.8 cm
anechoic right upper pole renal mass most consistent with a cyst. No
solid mass or hydronephrosis visualized.

Left Kidney:

Length: 14.1 cm. Echogenicity within normal limits. No mass or
hydronephrosis visualized.

Bladder:

Appears normal for degree of bladder distention. Prostatic
enlargement.
IMPRESSION: No obstructive uropathy.

## 2017-11-12 ENCOUNTER — Ambulatory Visit: Payer: Medicare Other | Admitting: Urology

## 2018-06-24 ENCOUNTER — Ambulatory Visit: Payer: Medicare Other | Admitting: Family

## 2024-06-01 ENCOUNTER — Other Ambulatory Visit (HOSPITAL_BASED_OUTPATIENT_CLINIC_OR_DEPARTMENT_OTHER): Payer: Self-pay | Admitting: Family Medicine

## 2024-06-01 DIAGNOSIS — Z780 Asymptomatic menopausal state: Secondary | ICD-10-CM
